# Patient Record
Sex: Male | Born: 1975 | Race: White | Hispanic: No | Marital: Single | Smoking: Current every day smoker
Health system: Southern US, Community
[De-identification: ages and names within clinical notes are randomized; demographics above are authoritative.]

## PROBLEM LIST (undated history)

## (undated) DIAGNOSIS — G510 Bell's palsy: Secondary | ICD-10-CM

## (undated) DIAGNOSIS — E079 Disorder of thyroid, unspecified: Secondary | ICD-10-CM

## (undated) HISTORY — PX: KNEE SURGERY: SHX244

---

## 2006-05-26 ENCOUNTER — Emergency Department (HOSPITAL_COMMUNITY): Admission: EM | Admit: 2006-05-26 | Discharge: 2006-05-26 | Payer: Self-pay | Admitting: Emergency Medicine

## 2006-06-04 ENCOUNTER — Emergency Department (HOSPITAL_COMMUNITY): Admission: EM | Admit: 2006-06-04 | Discharge: 2006-06-05 | Payer: Self-pay | Admitting: Emergency Medicine

## 2006-06-28 ENCOUNTER — Emergency Department (HOSPITAL_COMMUNITY): Admission: EM | Admit: 2006-06-28 | Discharge: 2006-06-28 | Payer: Self-pay | Admitting: Emergency Medicine

## 2006-07-02 ENCOUNTER — Emergency Department (HOSPITAL_COMMUNITY): Admission: EM | Admit: 2006-07-02 | Discharge: 2006-07-02 | Payer: Self-pay | Admitting: Emergency Medicine

## 2010-08-21 ENCOUNTER — Emergency Department (HOSPITAL_COMMUNITY): Admission: EM | Admit: 2010-08-21 | Discharge: 2010-02-28 | Payer: Self-pay | Admitting: Emergency Medicine

## 2016-04-27 ENCOUNTER — Encounter (HOSPITAL_COMMUNITY): Payer: Self-pay | Admitting: Emergency Medicine

## 2016-04-27 ENCOUNTER — Emergency Department (HOSPITAL_COMMUNITY): Payer: Self-pay

## 2016-04-27 ENCOUNTER — Emergency Department (HOSPITAL_COMMUNITY)
Admission: EM | Admit: 2016-04-27 | Discharge: 2016-04-28 | Disposition: A | Discharge status: Home / Self Care | Payer: Self-pay | Attending: Emergency Medicine | Admitting: Emergency Medicine

## 2016-04-27 DIAGNOSIS — Y9389 Activity, other specified: Secondary | ICD-10-CM | POA: Insufficient documentation

## 2016-04-27 DIAGNOSIS — S61011A Laceration without foreign body of right thumb without damage to nail, initial encounter: Secondary | ICD-10-CM | POA: Insufficient documentation

## 2016-04-27 DIAGNOSIS — Z23 Encounter for immunization: Secondary | ICD-10-CM | POA: Insufficient documentation

## 2016-04-27 DIAGNOSIS — E039 Hypothyroidism, unspecified: Secondary | ICD-10-CM | POA: Insufficient documentation

## 2016-04-27 DIAGNOSIS — S21211A Laceration without foreign body of right back wall of thorax without penetration into thoracic cavity, initial encounter: Secondary | ICD-10-CM | POA: Insufficient documentation

## 2016-04-27 DIAGNOSIS — Y929 Unspecified place or not applicable: Secondary | ICD-10-CM | POA: Insufficient documentation

## 2016-04-27 DIAGNOSIS — E872 Acidosis, unspecified: Secondary | ICD-10-CM

## 2016-04-27 DIAGNOSIS — S71112A Laceration without foreign body, left thigh, initial encounter: Secondary | ICD-10-CM | POA: Insufficient documentation

## 2016-04-27 DIAGNOSIS — Z79899 Other long term (current) drug therapy: Secondary | ICD-10-CM | POA: Insufficient documentation

## 2016-04-27 DIAGNOSIS — Z5181 Encounter for therapeutic drug level monitoring: Secondary | ICD-10-CM | POA: Insufficient documentation

## 2016-04-27 DIAGNOSIS — Y999 Unspecified external cause status: Secondary | ICD-10-CM | POA: Insufficient documentation

## 2016-04-27 DIAGNOSIS — F17200 Nicotine dependence, unspecified, uncomplicated: Secondary | ICD-10-CM | POA: Insufficient documentation

## 2016-04-27 HISTORY — DX: Disorder of thyroid, unspecified: E07.9

## 2016-04-27 HISTORY — DX: Bell's palsy: G51.0

## 2016-04-27 LAB — COMPREHENSIVE METABOLIC PANEL
ALT: 26 U/L (ref 17–63)
ANION GAP: 9 (ref 5–15)
AST: 37 U/L (ref 15–41)
Albumin: 3.7 g/dL (ref 3.5–5.0)
Alkaline Phosphatase: 73 U/L (ref 38–126)
BUN: 6 mg/dL (ref 6–20)
CALCIUM: 8.5 mg/dL — AB (ref 8.9–10.3)
CHLORIDE: 110 mmol/L (ref 101–111)
CO2: 21 mmol/L — AB (ref 22–32)
Creatinine, Ser: 0.92 mg/dL (ref 0.61–1.24)
GFR calc non Af Amer: >60 mL/min (ref >60)
Glucose, Bld: 95 mg/dL (ref 65–99)
Potassium: 3.4 mmol/L — ABNORMAL LOW (ref 3.5–5.1)
SODIUM: 140 mmol/L (ref 135–145)
Total Bilirubin: 0.2 mg/dL — ABNORMAL LOW (ref 0.3–1.2)
Total Protein: 6 g/dL — ABNORMAL LOW (ref 6.5–8.1)

## 2016-04-27 LAB — PREPARE FRESH FROZEN PLASMA
Unit division: 0
Unit division: 0

## 2016-04-27 LAB — CBC
HCT: 39.4 % (ref 39.0–52.0)
HEMOGLOBIN: 13.5 g/dL (ref 13.0–17.0)
MCH: 28.1 pg (ref 26.0–34.0)
MCHC: 34.3 g/dL (ref 30.0–36.0)
MCV: 82.1 fL (ref 78.0–100.0)
Platelets: 254 10*3/uL (ref 150–400)
RBC: 4.8 MIL/uL (ref 4.22–5.81)
RDW: 13.1 % (ref 11.5–15.5)
WBC: 6.1 10*3/uL (ref 4.0–10.5)

## 2016-04-27 LAB — ABO/RH: ABO/RH(D): O POS

## 2016-04-27 LAB — I-STAT CHEM 8, ED
BUN: 5 mg/dL — ABNORMAL LOW (ref 6–20)
CALCIUM ION: 1.02 mmol/L — AB (ref 1.13–1.30)
Chloride: 106 mmol/L (ref 101–111)
Creatinine, Ser: 1 mg/dL (ref 0.61–1.24)
GLUCOSE: 92 mg/dL (ref 65–99)
HCT: 39 % (ref 39.0–52.0)
HEMOGLOBIN: 13.3 g/dL (ref 13.0–17.0)
Potassium: 3.4 mmol/L — ABNORMAL LOW (ref 3.5–5.1)
Sodium: 143 mmol/L (ref 135–145)
TCO2: 20 mmol/L (ref 0–100)

## 2016-04-27 LAB — PROTIME-INR
INR: 0.98
PROTHROMBIN TIME: 13 s (ref 11.4–15.2)

## 2016-04-27 LAB — I-STAT CG4 LACTIC ACID, ED
Lactic Acid, Venous: 4.1 mmol/L (ref 0.5–1.9)
Lactic Acid, Venous: 1.65 mmol/L (ref 0.5–1.9)

## 2016-04-27 LAB — ETHANOL: ALCOHOL ETHYL (B): 138 mg/dL — AB (ref <5)

## 2016-04-27 MED ORDER — SODIUM CHLORIDE 0.9 % IV BOLUS (SEPSIS)
1000.0000 mL | Freq: Once | INTRAVENOUS | Status: AC
  Administered 2016-04-27: 1000 mL via INTRAVENOUS

## 2016-04-27 MED ORDER — SODIUM CHLORIDE 0.9 % IV SOLN
INTRAVENOUS | Status: AC | PRN
  Administered 2016-04-27: 1000 mL via INTRAVENOUS

## 2016-04-27 MED ORDER — CARBAMAZEPINE ER 200 MG PO TB12
200.0000 mg | ORAL_TABLET | Freq: Once | ORAL | Status: DC
  Filled 2016-04-27: qty 1

## 2016-04-27 MED ORDER — POTASSIUM CHLORIDE CRYS ER 20 MEQ PO TBCR
80.0000 meq | EXTENDED_RELEASE_TABLET | Freq: Once | ORAL | Status: AC
  Administered 2016-04-27: 80 meq via ORAL
  Filled 2016-04-27: qty 4

## 2016-04-27 MED ORDER — AMITRIPTYLINE HCL 25 MG PO TABS
50.0000 mg | ORAL_TABLET | Freq: Every day | ORAL | Status: DC
  Filled 2016-04-27: qty 2

## 2016-04-27 MED ORDER — TETANUS-DIPHTH-ACELL PERTUSSIS 5-2.5-18.5 LF-MCG/0.5 IM SUSP
0.5000 mL | Freq: Once | INTRAMUSCULAR | Status: AC
  Administered 2016-04-27: 0.5 mL via INTRAMUSCULAR

## 2016-04-27 MED ORDER — GABAPENTIN 300 MG PO CAPS
600.0000 mg | ORAL_CAPSULE | Freq: Two times a day (BID) | ORAL | Status: DC
  Filled 2016-04-27: qty 2

## 2016-04-27 MED ORDER — PHENOBARBITAL 60 MG PO TABS
60.0000 mg | ORAL_TABLET | Freq: Once | ORAL | Status: DC
  Filled 2016-04-27: qty 1

## 2016-04-27 MED ORDER — TETANUS-DIPHTH-ACELL PERTUSSIS 5-2.5-18.5 LF-MCG/0.5 IM SUSP
INTRAMUSCULAR | Status: AC
  Administered 2016-04-27: 0.5 mL via INTRAMUSCULAR
  Filled 2016-04-27: qty 0.5

## 2016-04-27 MED ORDER — LIDOCAINE HCL (PF) 1 % IJ SOLN
20.0000 mL | Freq: Once | INTRAMUSCULAR | Status: AC
  Administered 2016-04-28: 20 mL via INTRADERMAL
  Filled 2016-04-27: qty 20
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
## 2016-04-27 NOTE — Progress Notes (Signed)
Orthopedic Tech Progress Note Patient Details:  Sean Strickland 24-Dec-1975 [id number] Level 1 trauma ortho visit. Patient ID: Sean Strickland, male   DOB: 24-Dec-1975, 40 y.o.   MRN: [id number]   Sean Strickland, Sean Strickland 04/27/2016, 8:47 PM
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
## 2016-04-27 NOTE — ED Notes (Signed)
Pt verbally aggressive and threatening to staff and to [place] police at bedside. MD aware and at bedside. No new orders.
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
## 2016-04-27 NOTE — ED Notes (Signed)
Patient refused Urine Analysis.

## 2016-04-28 LAB — TYPE AND SCREEN
ABO/RH(D): O POS
Antibody Screen: NEGATIVE
UNIT DIVISION: 0
UNIT DIVISION: 0

## 2016-04-28 LAB — CDS SEROLOGY

## 2016-04-28 MED ORDER — PHENOBARBITAL 32.4 MG PO TABS
64.8000 mg | ORAL_TABLET | Freq: Once | ORAL | Status: DC
  Filled 2016-04-28: qty 2

## 2016-04-28 MED ORDER — PENICILLIN V POTASSIUM 250 MG PO TABS: 500.0000 mg | ORAL_TABLET | Freq: Once | ORAL | Status: DC

## 2016-04-28 MED ORDER — PENICILLIN V POTASSIUM 500 MG PO TABS: 500.0000 mg | ORAL_TABLET | Freq: Three times a day (TID) | ORAL | 0 refills | Status: AC

## 2016-04-28 NOTE — Discharge Instructions (Signed)
You can take tylenol 650mg  every 6 hours for pain. You can add Ibuprofen 400 mg 3 hours later if you still have pain.   Change your dressings every 12 hours. Apply bacitracin to the wound during dressing changes. Watch out for signs of infection and fevers >100.75F

## 2016-04-28 NOTE — ED Notes (Signed)
Pt arrested by [employer] p/t to discharge and administration of medications, see EMAR.
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
## 2017-07-05 ENCOUNTER — Inpatient Hospital Stay (HOSPITAL_COMMUNITY)
Admission: EM | Admit: 2017-07-05 | Discharge: 2017-07-07 | DRG: 080 | Payer: Self-pay | Attending: Family Medicine | Admitting: Family Medicine

## 2017-07-05 ENCOUNTER — Encounter (HOSPITAL_COMMUNITY): Payer: Self-pay

## 2017-07-05 ENCOUNTER — Emergency Department (HOSPITAL_COMMUNITY): Payer: Self-pay

## 2017-07-05 DIAGNOSIS — G9341 Metabolic encephalopathy: Secondary | ICD-10-CM | POA: Diagnosis present

## 2017-07-05 DIAGNOSIS — E039 Hypothyroidism, unspecified: Secondary | ICD-10-CM | POA: Diagnosis present

## 2017-07-05 DIAGNOSIS — E860 Dehydration: Secondary | ICD-10-CM | POA: Diagnosis present

## 2017-07-05 DIAGNOSIS — E162 Hypoglycemia, unspecified: Secondary | ICD-10-CM

## 2017-07-05 DIAGNOSIS — F319 Bipolar disorder, unspecified: Secondary | ICD-10-CM | POA: Diagnosis present

## 2017-07-05 DIAGNOSIS — R001 Bradycardia, unspecified: Secondary | ICD-10-CM

## 2017-07-05 DIAGNOSIS — F3110 Bipolar disorder, current episode manic without psychotic features, unspecified: Secondary | ICD-10-CM | POA: Diagnosis present

## 2017-07-05 DIAGNOSIS — Z9119 Patient's noncompliance with other medical treatment and regimen: Secondary | ICD-10-CM

## 2017-07-05 DIAGNOSIS — R400 Somnolence: Secondary | ICD-10-CM

## 2017-07-05 DIAGNOSIS — E035 Myxedema coma: Principal | ICD-10-CM

## 2017-07-05 DIAGNOSIS — G9340 Encephalopathy, unspecified: Secondary | ICD-10-CM | POA: Diagnosis present

## 2017-07-05 DIAGNOSIS — F17210 Nicotine dependence, cigarettes, uncomplicated: Secondary | ICD-10-CM | POA: Diagnosis present

## 2017-07-05 DIAGNOSIS — M6282 Rhabdomyolysis: Secondary | ICD-10-CM

## 2017-07-05 LAB — CBC
HCT: 38.9 % — ABNORMAL LOW (ref 39.0–52.0)
Hemoglobin: 13.5 g/dL (ref 13.0–17.0)
MCH: 28.1 pg (ref 26.0–34.0)
MCHC: 34.7 g/dL (ref 30.0–36.0)
MCV: 81 fL (ref 78.0–100.0)
Platelets: 277 10*3/uL (ref 150–400)
RBC: 4.8 MIL/uL (ref 4.22–5.81)
RDW: 13.1 % (ref 11.5–15.5)
WBC: 9.5 10*3/uL (ref 4.0–10.5)

## 2017-07-05 LAB — URINALYSIS, ROUTINE W REFLEX MICROSCOPIC
Glucose, UA: 50 mg/dL — AB
Hgb urine dipstick: NEGATIVE
Ketones, ur: 20 mg/dL — AB
Leukocytes, UA: NEGATIVE
Nitrite: NEGATIVE
Protein, ur: 100 mg/dL — AB
Specific Gravity, Urine: 1.034 — ABNORMAL HIGH (ref 1.005–1.030)
Squamous Epithelial / HPF: NONE SEEN
pH: 5 (ref 5.0–8.0)

## 2017-07-05 LAB — COMPREHENSIVE METABOLIC PANEL WITH GFR
ALT: 59 U/L (ref 17–63)
AST: 134 U/L — ABNORMAL HIGH (ref 15–41)
Albumin: 4.2 g/dL (ref 3.5–5.0)
Alkaline Phosphatase: 80 U/L (ref 38–126)
Anion gap: 9 (ref 5–15)
BUN: 25 mg/dL — ABNORMAL HIGH (ref 6–20)
CO2: 25 mmol/L (ref 22–32)
Calcium: 8.7 mg/dL — ABNORMAL LOW (ref 8.9–10.3)
Chloride: 104 mmol/L (ref 101–111)
Creatinine, Ser: 1.01 mg/dL (ref 0.61–1.24)
GFR calc Af Amer: >60 mL/min
GFR calc non Af Amer: >60 mL/min
Glucose, Bld: 77 mg/dL (ref 65–99)
Potassium: 4 mmol/L (ref 3.5–5.1)
Sodium: 138 mmol/L (ref 135–145)
Total Bilirubin: 1.2 mg/dL (ref 0.3–1.2)
Total Protein: 6.9 g/dL (ref 6.5–8.1)

## 2017-07-05 LAB — RAPID URINE DRUG SCREEN, HOSP PERFORMED
Amphetamines: NOT DETECTED
Barbiturates: NOT DETECTED
Benzodiazepines: NOT DETECTED
Cocaine: NOT DETECTED
Opiates: NOT DETECTED
Tetrahydrocannabinol: POSITIVE — AB

## 2017-07-05 LAB — CBG MONITORING, ED
GLUCOSE-CAPILLARY: 148 mg/dL — AB (ref 65–99)
Glucose-Capillary: 100 mg/dL — ABNORMAL HIGH (ref 65–99)
Glucose-Capillary: 174 mg/dL — ABNORMAL HIGH (ref 65–99)
Glucose-Capillary: 59 mg/dL — ABNORMAL LOW (ref 65–99)
Glucose-Capillary: 74 mg/dL (ref 65–99)
Glucose-Capillary: 99 mg/dL (ref 65–99)

## 2017-07-05 MED ORDER — THIAMINE HCL 100 MG/ML IJ SOLN
100.0000 mg | Freq: Every day | INTRAMUSCULAR | Status: DC
  Administered 2017-07-06: 100 mg via INTRAVENOUS
  Filled 2017-07-05: qty 2

## 2017-07-05 MED ORDER — DEXTROSE 50 % IV SOLN
1.0000 | Freq: Once | INTRAVENOUS | Status: AC
  Administered 2017-07-05: 50 mL via INTRAVENOUS
  Filled 2017-07-05: qty 50

## 2017-07-05 MED ORDER — SODIUM CHLORIDE 0.9 % IV BOLUS (SEPSIS)
500.0000 mL | Freq: Once | INTRAVENOUS | Status: AC
  Administered 2017-07-06: 500 mL via INTRAVENOUS

## 2017-07-05 MED ORDER — DEXTROSE 50 % IV SOLN
INTRAVENOUS | Status: AC
  Filled 2017-07-05: qty 50

## 2017-07-05 MED ORDER — DEXTROSE 50 % IV SOLN
1.0000 | Freq: Once | INTRAVENOUS | Status: AC
  Administered 2017-07-05: 50 mL via INTRAVENOUS
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
## 2017-07-05 NOTE — H&P (Signed)
Sean Strickland WUJ:[id number] DOB: July 27, 1976 DOA: 07/05/2017     PCP: Patient, No Pcp Per   Outpatient Specialists: none Patient coming from:  Homeless in the past From jail  Chief Complaint: confusion  HPI: Sean Strickland is a 41 y.o. male with medical history significant of hypothyroidism and psychiatric disorder    Presented with altered mental status comes from [place] was given 2 mg of Ativan IM prior to arriving to ER. Unable to provide his own history but per jail staff has become more confused over past 1-2 weeks. He has been held in isolation due to aggressive behavior for at least 1 week. Was started on Effexor, phenobarbital, Neurontin, Amitriptalyne, Tegretol,  last week was having hallucinations. Prior to arrival he has been walking but very confused. Unsure if was taking any of the medications. Has hx of Hypothyroidism  Was supposed to be on synthroid but unclear if was taking it.  Initially hypoglycemic Glu 50's still lethargic treated with D50. Temp 97.4, bradycardic into 50's Work up showed evidence of severe Hypothyroidism likely due to noncompliance  IN ER:  Temp (24hrs), Avg:98 F (36.7 C), Min:97.4 F (36.3 C), Max:98.6 F (37 C)      on arrival  ED Triage Vitals  Enc Vitals Group     BP 07/05/17 1825 120/90     Pulse Rate 07/05/17 1825 69     Resp 07/05/17 1825 16     Temp 07/05/17 1825 98.6 F (37 C)     Temp Source 07/05/17 1825 Oral     SpO2 07/05/17 1825 100 %     Weight --      Height --      Head Circumference --      Peak Flow --      Pain Score 07/05/17 1911 0     Pain Loc --      Pain Edu? --      Excl. in GC? --     Latest  RR 11 HR 52 BP 95/83 Na 138 K 4.0 BUN 25 alb 4.2 WBC 9.5 Hg 13.5 plt 277  CK 2800 TSH 76 T4 < 0.25 CXR non acute CT head frontal hematoma of the scalp Following Medications were ordered in ER: Medications  dextrose 50 % solution 50 mL (50 mLs Intravenous Given 07/05/17 1946)  dextrose 50 %  solution 50 mL (50 mLs Intravenous Given 07/05/17 2218)    Hospitalist was called for admission for altered mental status  Review of Systems:    Pertinent positives include: Confusion/hallucinations Otherwise unable to obtain detailed history secondary to altered mental status  Past Medical History: Past Medical History:  Diagnosis Date  . Bell's palsy   . Thyroid disease    hypothyroid   Past Surgical History:  Procedure Laterality Date  . KNEE SURGERY Right      Social History:  Ambulatory   independently     reports that he has been smoking.  He has never used smokeless tobacco. He reports that he uses drugs, including Marijuana. He reports that he does not drink alcohol.  Allergies:  No Known Allergies     Family History: Unable to obtain Medications: Prior to Admission medications   Medication Sig Start Date End Date Taking? Authorizing Provider  amitriptyline (ELAVIL) 50 MG tablet Take 50 mg by mouth at bedtime.    [provider]  carbamazepine (TEGRETOL XR) 200 MG 12 hr tablet Take 200 mg by mouth 2 (two) times daily.  [provider]  gabapentin (NEURONTIN) 300 MG capsule Take 600 mg by mouth 2 (two) times daily.    [provider]  levothyroxine (SYNTHROID, LEVOTHROID) 175 MCG tablet Take 175 mcg by mouth daily before breakfast.    [provider]  naproxen (NAPROSYN) 500 MG tablet Take 500 mg by mouth 2 (two) times daily with a meal.    [provider]  PHENObarbital (LUMINAL) 30 MG tablet Take 60 mg by mouth 2 (two) times daily.    [provider]  venlafaxine XR (EFFEXOR-XR) 75 MG 24 hr capsule Take 225 mg by mouth daily with breakfast.    [provider]    Physical Exam: Patient Vitals for the past 24 hrs:  BP Temp Temp src Pulse Resp SpO2  07/05/17 2315 95/83 - - - 11 -  07/05/17 2300 110/80 - - - 15 -  07/05/17 2245 114/90 - - - 11 -  07/05/17 2230 106/69 - - - 12 -  07/05/17 2215  110/78 - - - 16 -  07/05/17 2200 116/80 - - (!) 45 11 100 %  07/05/17 2145 - - - - 16 -  07/05/17 2130 91/70 - - (!) 47 11 99 %  07/05/17 2115 102/72 - - (!) 54 10 100 %  07/05/17 2113 - (!) 97.4 F (36.3 C) Rectal - - -  07/05/17 1945 - - - (!) 44 11 92 %  07/05/17 1930 95/67 - - - - -  07/05/17 1825 120/90 98.6 F (37 C) Oral 69 16 100 %    1. General:  in No Acute distress   Chronically ill -appearing 2. Psychological: Alert but not Oriented 3. Head/ENT:    Dry Mucous Membranes                          Head Non traumatic, neck supple                          Normal   Dentition 4. SKIN:  decreased Skin turgor,  Skin clean Dry and intact no rash 5. Heart: Regular rate and rhythm no  Murmur, no Rub or gallop 6. Lungs:  no wheezes or crackles   7. Abdomen: Lower abdominal tenderness, distended diminished bowel sounds present 8. Lower extremities: no clubbing, cyanosis, or edema 9. Neurologically Grossly intact, moving all 4 extremities equally spontaneously not cooperative with exam  10. MSK: Normal range of motion   body mass index is unknown because there is no height or weight on file.  Labs on Admission:   Labs on Admission: I have personally reviewed following labs and imaging studies  CBC:  Recent Labs Lab 07/05/17 1927  WBC 9.5  HGB 13.5  HCT 38.9*  MCV 81.0  PLT 277   Basic Metabolic Panel:  Recent Labs Lab 07/05/17 1927  NA 138  K 4.0  CL 104  CO2 25  GLUCOSE 77  BUN 25*  CREATININE 1.01  CALCIUM 8.7*   GFR: CrCl cannot be calculated (Unknown ideal weight.). Liver Function Tests:  Recent Labs Lab 07/05/17 1927  AST 134*  ALT 59  ALKPHOS 80  BILITOT 1.2  PROT 6.9  ALBUMIN 4.2   No results for input(s): LIPASE, AMYLASE in the last 168 hours. No results for input(s): AMMONIA in the last 168 hours. Coagulation Profile: No results for input(s): INR, PROTIME in the last 168 hours. Cardiac Enzymes: No results for  input(s): CKTOTAL, CKMB,  CKMBINDEX, TROPONINI in the last 168 hours. BNP (last 3 results) No results for input(s): PROBNP in the last 8760 hours. HbA1C: No results for input(s): HGBA1C in the last 72 hours. CBG:  Recent Labs Lab 07/05/17 1940 07/05/17 2012 07/05/17 2116 07/05/17 2208 07/05/17 2246  GLUCAP 59* 148* 100* 74 174*   Lipid Profile: No results for input(s): CHOL, HDL, LDLCALC, TRIG, CHOLHDL, LDLDIRECT in the last 72 hours. Thyroid Function Tests: No results for input(s): TSH, T4TOTAL, FREET4, T3FREE, THYROIDAB in the last 72 hours. Anemia Panel: No results for input(s): VITAMINB12, FOLATE, FERRITIN, TIBC, IRON, RETICCTPCT in the last 72 hours. Urine analysis:    Component Value Date/Time   COLORURINE AMBER (A) 07/05/2017 2150   APPEARANCEUR HAZY (A) 07/05/2017 2150   LABSPEC 1.034 (H) 07/05/2017 2150   PHURINE 5.0 07/05/2017 2150   GLUCOSEU 50 (A) 07/05/2017 2150   HGBUR NEGATIVE 07/05/2017 2150   BILIRUBINUR SMALL (A) 07/05/2017 2150   KETONESUR 20 (A) 07/05/2017 2150   PROTEINUR 100 (A) 07/05/2017 2150   NITRITE NEGATIVE 07/05/2017 2150   LEUKOCYTESUR NEGATIVE 07/05/2017 2150   Sepsis Labs: @LABRCNTIP (procalcitonin:4,lacticidven:4) )No results found for this or any previous visit (from the past 240 hour(s)).    UA  no evidence of UTI   No results found for: HGBA1C  CrCl cannot be calculated (Unknown ideal weight.).  BNP (last 3 results) No results for input(s): PROBNP in the last 8760 hours.   ECG REPORT  Independently reviewed Rate: 57  Rhythm: Sinus bradycardia, slightly prolonged PR ST&T Change: No acute ischemic changes   QTC 405  There were no vitals filed for this visit.   Cultures: No results found for: SDES, SPECREQUEST, CULT, REPTSTATUS   Radiological Exams on Admission: Ct Head Wo Contrast  Result Date: 07/05/2017 CLINICAL DATA:  Altered mental status.  Pale, lethargic. EXAM: CT HEAD WITHOUT CONTRAST TECHNIQUE: Contiguous axial images were obtained  from the base of the skull through the vertex without intravenous contrast. COMPARISON:  None. FINDINGS: Brain: Ventricles are normal in size and configuration. All areas of the brain demonstrate normal gray-white matter attenuation. There is no mass, hemorrhage, edema or other evidence of acute parenchymal abnormality. No extra-axial hemorrhage. Vascular: No hyperdense vessel or unexpected calcification. Skull: Normal. Negative for fracture or focal lesion. Sinuses/Orbits: No acute finding. Other: Probable mild soft tissue edema within the superficial soft tissues overlying the midline frontal bones. IMPRESSION: 1. Probable mild soft tissue edema overlying the midline frontal bones. No underlying fracture. 2. No acute intracranial abnormality. No intracranial mass, hemorrhage or edema. Electronically Signed   By: Bary Richard M.D.   On: 07/05/2017 21:06   Dg Chest Portable 1 View  Result Date: 07/05/2017 CLINICAL DATA:  Acute onset of altered mental status. Initial encounter. EXAM: PORTABLE CHEST 1 VIEW COMPARISON:  None. FINDINGS: The lungs are well-aerated and clear. There is no evidence of focal opacification, pleural effusion or pneumothorax. The cardiomediastinal silhouette is within normal limits. No acute osseous abnormalities are seen. IMPRESSION: No acute cardiopulmonary process seen. Electronically Signed   By: Roanna Raider M.D.   On: 07/05/2017 22:46    Chart has been reviewed    Assessment/Plan   41 y.o. male with medical history significant of hypothyroidism and psychiatric disorder  Admitted for AMS  Present on Admission: . Myxedema coma (HCC)/ myxedema madness; mid agitated delirium in the setting of severe hypothyroidism possibly secondary to noncompliance. Admit to step down initiate stress dose steroids, IV Synthroid 200  mcg now and then 100 daily as well as T3 5 milligrams now and 2.5 every 8 until stable. Monitor for arrhythmias . Bradycardia in the setting of hypothyroidism  monitor and step down expect improvement . Acute metabolic encephalopathy secondary to severe hypothyroidism and polypharmacy U tox negative for phenobarbital unsure if patient actually was taking any of this medications will need to clarify in a.m. monitor for any signs of withdrawal from benzodiazepines or phenobarbital unsure if patient has been on prolonged course of either one. Given altered mental status will check EEG and monitor for seizure activity.    . Rhabdomyolysis setting of severe hypothyroidism will rehydrate and check CK in a.m.   Other plan as per orders.  DVT prophylaxis:  SCD    Code Status:  FULL CODE   Family Communication:   Family not  at  Bedside    Disposition Plan:     Back to current facility when stable                                                                           Consults called: Behavioaral health   Admission status: obs   Level of care tele     I have spent a total of 76 min on this admission    Vauda Salvucci 07/06/2017, 2:53 AM     [facility]  Pager [phone]   after 2 AM please page floor coverage PA If 7AM-7PM, please contact the day team taking care of the patient  [email or web address]  Password [email or web address]

## 2017-07-05 NOTE — ED Triage Notes (Signed)
[facility]. Pt is AOX4 at baseline. Pt is very pale in color, lethargic. Pt was given 2mg  of ativan IM prior to leaving the jail.

## 2017-07-06 ENCOUNTER — Observation Stay (HOSPITAL_COMMUNITY): Payer: Self-pay

## 2017-07-06 ENCOUNTER — Encounter (HOSPITAL_COMMUNITY): Payer: Self-pay | Admitting: Internal Medicine

## 2017-07-06 DIAGNOSIS — M6282 Rhabdomyolysis: Secondary | ICD-10-CM | POA: Diagnosis present

## 2017-07-06 DIAGNOSIS — F1210 Cannabis abuse, uncomplicated: Secondary | ICD-10-CM

## 2017-07-06 DIAGNOSIS — F17210 Nicotine dependence, cigarettes, uncomplicated: Secondary | ICD-10-CM

## 2017-07-06 DIAGNOSIS — F3110 Bipolar disorder, current episode manic without psychotic features, unspecified: Secondary | ICD-10-CM

## 2017-07-06 DIAGNOSIS — E035 Myxedema coma: Secondary | ICD-10-CM | POA: Diagnosis present

## 2017-07-06 DIAGNOSIS — G9340 Encephalopathy, unspecified: Secondary | ICD-10-CM

## 2017-07-06 LAB — CBG MONITORING, ED
GLUCOSE-CAPILLARY: 102 mg/dL — AB (ref 65–99)
GLUCOSE-CAPILLARY: 131 mg/dL — AB (ref 65–99)
GLUCOSE-CAPILLARY: 147 mg/dL — AB (ref 65–99)
GLUCOSE-CAPILLARY: 61 mg/dL — AB (ref 65–99)
GLUCOSE-CAPILLARY: 65 mg/dL (ref 65–99)
GLUCOSE-CAPILLARY: 82 mg/dL (ref 65–99)
GLUCOSE-CAPILLARY: 93 mg/dL (ref 65–99)
Glucose-Capillary: 67 mg/dL (ref 65–99)
Glucose-Capillary: 89 mg/dL (ref 65–99)

## 2017-07-06 LAB — CK
Total CK: 2781 U/L — ABNORMAL HIGH (ref 49–397)
Total CK: 2313 U/L — ABNORMAL HIGH (ref 49–397)

## 2017-07-06 LAB — COMPREHENSIVE METABOLIC PANEL
ALBUMIN: 3.8 g/dL (ref 3.5–5.0)
ALT: 48 U/L (ref 17–63)
ANION GAP: 10 (ref 5–15)
AST: 101 U/L — ABNORMAL HIGH (ref 15–41)
Alkaline Phosphatase: 75 U/L (ref 38–126)
BILIRUBIN TOTAL: 0.8 mg/dL (ref 0.3–1.2)
BUN: 22 mg/dL — ABNORMAL HIGH (ref 6–20)
CO2: 21 mmol/L — ABNORMAL LOW (ref 22–32)
Calcium: 8.2 mg/dL — ABNORMAL LOW (ref 8.9–10.3)
Chloride: 105 mmol/L (ref 101–111)
Creatinine, Ser: 0.8 mg/dL (ref 0.61–1.24)
GFR calc Af Amer: >60 mL/min (ref >60)
GFR calc non Af Amer: >60 mL/min (ref >60)
GLUCOSE: 71 mg/dL (ref 65–99)
POTASSIUM: 3.3 mmol/L — AB (ref 3.5–5.1)
Sodium: 136 mmol/L (ref 135–145)
TOTAL PROTEIN: 5.9 g/dL — AB (ref 6.5–8.1)

## 2017-07-06 LAB — I-STAT VENOUS BLOOD GAS, ED
Acid-Base Excess: 3 mmol/L — ABNORMAL HIGH (ref 0.0–2.0)
Bicarbonate: 28.2 mmol/L — ABNORMAL HIGH (ref 20.0–28.0)
O2 SAT: 56 %
PH VEN: 7.401 (ref 7.250–7.430)
TCO2: 30 mmol/L (ref 22–32)
pCO2, Ven: 45.4 mmHg (ref 44.0–60.0)
pO2, Ven: 30 mmHg — CL (ref 32.0–45.0)

## 2017-07-06 LAB — GLUCOSE, CAPILLARY
Glucose-Capillary: 104 mg/dL — ABNORMAL HIGH (ref 65–99)
Glucose-Capillary: 91 mg/dL (ref 65–99)

## 2017-07-06 LAB — CBC
HEMATOCRIT: 39.4 % (ref 39.0–52.0)
HEMOGLOBIN: 13.6 g/dL (ref 13.0–17.0)
MCH: 28 pg (ref 26.0–34.0)
MCHC: 34.5 g/dL (ref 30.0–36.0)
MCV: 81.2 fL (ref 78.0–100.0)
Platelets: 264 10*3/uL (ref 150–400)
RBC: 4.85 MIL/uL (ref 4.22–5.81)
RDW: 12.9 % (ref 11.5–15.5)
WBC: 8.5 10*3/uL (ref 4.0–10.5)

## 2017-07-06 LAB — TSH: TSH: 75.995 u[IU]/mL — AB (ref 0.350–4.500)

## 2017-07-06 LAB — AMMONIA: AMMONIA: 39 umol/L — AB (ref 9–35)

## 2017-07-06 LAB — LACTIC ACID, PLASMA: Lactic Acid, Venous: 1.3 mmol/L (ref 0.5–1.9)

## 2017-07-06 LAB — PHENOBARBITAL LEVEL

## 2017-07-06 LAB — MAGNESIUM
MAGNESIUM: 2.4 mg/dL (ref 1.7–2.4)
Magnesium: 2.4 mg/dL (ref 1.7–2.4)

## 2017-07-06 LAB — PHOSPHORUS
PHOSPHORUS: 3 mg/dL (ref 2.5–4.6)
Phosphorus: 3.4 mg/dL (ref 2.5–4.6)

## 2017-07-06 LAB — HIV ANTIBODY (ROUTINE TESTING W REFLEX): HIV SCREEN 4TH GENERATION: NONREACTIVE

## 2017-07-06 LAB — CORTISOL: CORTISOL PLASMA: 18.1 ug/dL

## 2017-07-06 LAB — T4, FREE

## 2017-07-06 MED ORDER — LEVOTHYROXINE SODIUM 100 MCG IV SOLR
200.0000 ug | INTRAVENOUS | Status: AC
  Administered 2017-07-06: 200 ug via INTRAVENOUS
  Filled 2017-07-06: qty 10

## 2017-07-06 MED ORDER — LEVOTHYROXINE SODIUM 175 MCG PO TABS: 175.0000 ug | ORAL_TABLET | Freq: Every day | ORAL | Status: DC

## 2017-07-06 MED ORDER — DEXTROSE-NACL 5-0.45 % IV SOLN
INTRAVENOUS | Status: DC
  Administered 2017-07-06: 1000 mL via INTRAVENOUS
  Administered 2017-07-06 – 2017-07-07 (×2): via INTRAVENOUS

## 2017-07-06 MED ORDER — LIOTHYRONINE SODIUM 5 MCG PO TABS
2.5000 ug | ORAL_TABLET | Freq: Three times a day (TID) | ORAL | Status: DC
  Administered 2017-07-06 – 2017-07-07 (×4): 2.5 ug via ORAL
  Filled 2017-07-06 (×4): qty 1

## 2017-07-06 MED ORDER — GABAPENTIN 300 MG PO CAPS
600.0000 mg | ORAL_CAPSULE | Freq: Two times a day (BID) | ORAL | Status: DC
  Administered 2017-07-06 – 2017-07-07 (×2): 600 mg via ORAL
  Filled 2017-07-06 (×2): qty 2

## 2017-07-06 MED ORDER — LEVOTHYROXINE SODIUM 100 MCG IV SOLR
100.0000 ug | Freq: Every day | INTRAVENOUS | Status: DC
  Administered 2017-07-07: 100 ug via INTRAVENOUS
  Filled 2017-07-06: qty 5

## 2017-07-06 MED ORDER — ONDANSETRON HCL 4 MG PO TABS: 4.0000 mg | ORAL_TABLET | Freq: Four times a day (QID) | ORAL | Status: DC | PRN

## 2017-07-06 MED ORDER — POLYETHYLENE GLYCOL 3350 17 G PO PACK: 17.0000 g | PACK | Freq: Every day | ORAL | Status: DC | PRN

## 2017-07-06 MED ORDER — ACETAMINOPHEN 325 MG PO TABS: 650.0000 mg | ORAL_TABLET | Freq: Four times a day (QID) | ORAL | Status: DC | PRN

## 2017-07-06 MED ORDER — DEXTROSE 50 % IV SOLN
INTRAVENOUS | Status: AC
  Administered 2017-07-06: 25 mL
  Filled 2017-07-06: qty 50

## 2017-07-06 MED ORDER — ONDANSETRON HCL 4 MG/2ML IJ SOLN: 4.0000 mg | Freq: Four times a day (QID) | INTRAMUSCULAR | Status: DC | PRN

## 2017-07-06 MED ORDER — ACETAMINOPHEN 650 MG RE SUPP: 650.0000 mg | Freq: Four times a day (QID) | RECTAL | Status: DC | PRN

## 2017-07-06 MED ORDER — SODIUM CHLORIDE 0.9 % IV SOLN
INTRAVENOUS | Status: DC
  Administered 2017-07-06: 1000 mL via INTRAVENOUS
  Administered 2017-07-06: 14:00:00 via INTRAVENOUS

## 2017-07-06 MED ORDER — LIOTHYRONINE SODIUM 5 MCG PO TABS
5.0000 ug | ORAL_TABLET | ORAL | Status: AC
  Administered 2017-07-06: 5 ug via ORAL
  Filled 2017-07-06: qty 1

## 2017-07-06 MED ORDER — DEXTROSE 50 % IV SOLN
25.0000 mL | Freq: Once | INTRAVENOUS | Status: AC
  Administered 2017-07-06: 25 mL via INTRAVENOUS
  Filled 2017-07-06: qty 50

## 2017-07-06 MED ORDER — HYDROCORTISONE NA SUCCINATE PF 100 MG IJ SOLR
100.0000 mg | Freq: Three times a day (TID) | INTRAMUSCULAR | Status: DC
  Administered 2017-07-06 – 2017-07-07 (×5): 100 mg via INTRAVENOUS
  Filled 2017-07-06 (×5): qty 2

## 2017-07-06 MED ORDER — VITAMIN B-1 100 MG PO TABS
100.0000 mg | ORAL_TABLET | Freq: Every day | ORAL | Status: DC
  Administered 2017-07-06 – 2017-07-07 (×2): 100 mg via ORAL
  Filled 2017-07-06 (×2): qty 1

## 2017-07-06 NOTE — ED Notes (Signed)
Attempted to help patient urinate, states he is unable at this time.

## 2017-07-06 NOTE — ED Notes (Signed)
102 BS

## 2017-07-06 NOTE — Progress Notes (Signed)
EEG complete - results pending 

## 2017-07-06 NOTE — ED Notes (Signed)
Pt is now awake and A&O x 4; Pt asking for family and to use phone; pt is in police custody and denied use of phone or contact with family at this time; Pt is calm and corporative with staff; Pt states he has not been taken  Some of his home medications as prescribed and he has not been receiving them while in jail. 2 Deputies remain at beside and pt remain in bilateral hand and feet cuffs; No injuries noted to hands or feet from cuffs;

## 2017-07-06 NOTE — ED Notes (Signed)
CBG 93 

## 2017-07-06 NOTE — ED Notes (Addendum)
Pt given 8oz of orange juice 

## 2017-07-06 NOTE — Consult Note (Signed)
[facility] Consult   Reason for Consult:  Psych medication management and AMS Referring Physician:  Dr. Wendee Beavers Patient Identification: Sean Strickland MRN:  [id number] Principal Diagnosis: Bipolar I disorder, most recent episode (or current) manic ([place]) Diagnosis:   Patient Active Problem List   Diagnosis Date Noted  . Acute encephalopathy [G93.40] 07/06/2017  . 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 Rhabdomyolysis [M62.82] 07/06/2017  . Bradycardia [R00.1] 07/05/2017  . Acute metabolic encephalopathy [W38.88] 07/05/2017    Total Time spent with patient: 1 hour  Subjective:   Sean Strickland is a 41 y.o. male patient admitted with confusion after receiving Ativan in the prison system.  HPI:  Sean Strickland is a 41 y.o. male, seen, chart reviewed for this face-to-face psychiatric consultation and evaluation of recent confusion. Reportedly patient has been suffering with the bipolar disorder, anger outbursts and then received 2 mg of Ativan intramuscular injection which leads to altered mental status and required evaluation in emergency department. Patient was also placed in medical floor for observation. During my evaluation patient is awake, alert, oriented time place person and situation. Patient stated that he has been receiving amitriptyline which he is allergic to and then angry about staff continue to give the same medication. Patient stated that he preferred to get Valium Xanax incident of amitriptyline. 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 Patient has been on Tegretol, phenobarbital, Neurontin and Effexor the patient also received some atropine as needed. Patient also suffering with hypothyroidism and takes Synthroid. Patient denies current symptoms of depression, psychosis, suicidal/homicidal ideation, intention or plans. Patient has capacity to make his own medical decisions and has  improved from his altered mental status since came to the hospital.   Past Psychiatric History: bipolar disorder receiving medication management from prison psychiatrist.  Risk to Self: Is patient at risk for suicide?: No Risk to Others:   Prior Inpatient Therapy:   Prior Outpatient Therapy:    Past Medical History:  Past Medical History:  Diagnosis Date  . Bell's palsy   . Thyroid disease    hypothyroid    Past Surgical History:  Procedure Laterality Date  . KNEE SURGERY Right    Family History: History reviewed. No pertinent family history. Family Psychiatric  History: Unknown Social History:  History  Alcohol Use No     History  Drug Use  . Types: Marijuana    Social History   Social History  . Marital status: Single    Spouse name: N/A  . Number of children: N/A  . Years of education: N/A   Social History Main Topics  . Smoking status: Current Every Day Smoker  . Smokeless tobacco: Never Used  . Alcohol use No  . Drug use: Yes    Types: Marijuana  . Sexual activity: Not Asked   Other Topics Concern  . None   Social History Narrative  . None   Additional Social History:    Allergies:  No Known Allergies  Labs:  Results for orders placed or performed during the hospital encounter of 07/05/17 (from the past 48 hour(s))  Comprehensive metabolic panel     Status: Abnormal   Collection Time: 07/05/17  7:27 PM  Result Value Ref Range   Sodium 138 135 - 145 mmol/L   Potassium 4.0 3.5 - 5.1 mmol/L   Chloride 104 101 - 111 mmol/L   CO2 25 22 - 32 mmol/L   Glucose, Bld 77  65 - 99 mg/dL   BUN 25 (H) 6 - 20 mg/dL   Creatinine, Ser 1.01 0.61 - 1.24 mg/dL   Calcium 8.7 (L) 8.9 - 10.3 mg/dL   Total Protein 6.9 6.5 - 8.1 g/dL   Albumin 4.2 3.5 - 5.0 g/dL   AST 134 (H) 15 - 41 U/L   ALT 59 17 - 63 U/L   Alkaline Phosphatase 80 38 - 126 U/L   Total Bilirubin 1.2 0.3 - 1.2 mg/dL   GFR calc non Af Amer >60 >60 mL/min   GFR calc Af Amer >60 >60 mL/min     Comment: (NOTE) The eGFR has been calculated using the CKD EPI equation. This calculation has not been validated in all clinical situations. eGFR's persistently <60 mL/min signify possible Chronic Kidney Disease.    Anion gap 9 5 - 15  CBC     Status: Abnormal   Collection Time: 07/05/17  7:27 PM  Result Value Ref Range   WBC 9.5 4.0 - 10.5 K/uL   RBC 4.80 4.22 - 5.81 MIL/uL   Hemoglobin 13.5 13.0 - 17.0 g/dL   HCT 38.9 (L) 39.0 - 52.0 %   MCV 81.0 78.0 - 100.0 fL   MCH 28.1 26.0 - 34.0 pg   MCHC 34.7 30.0 - 36.0 g/dL   RDW 13.1 11.5 - 15.5 %   Platelets 277 150 - 400 K/uL  CBG monitoring, ED     Status: Abnormal   Collection Time: 07/05/17  7:40 PM  Result Value Ref Range   Glucose-Capillary 59 (L) 65 - 99 mg/dL  CBG monitoring, ED     Status: Abnormal   Collection Time: 07/05/17  8:12 PM  Result Value Ref Range   Glucose-Capillary 148 (H) 65 - 99 mg/dL  POC CBG, ED     Status: Abnormal   Collection Time: 07/05/17  9:16 PM  Result Value Ref Range   Glucose-Capillary 100 (H) 65 - 99 mg/dL  Urinalysis, Routine w reflex microscopic     Status: Abnormal   Collection Time: 07/05/17  9:50 PM  Result Value Ref Range   Color, Urine AMBER (A) YELLOW    Comment: BIOCHEMICALS MAY BE AFFECTED BY COLOR   APPearance HAZY (A) CLEAR   Specific Gravity, Urine 1.034 (H) 1.005 - 1.030   pH 5.0 5.0 - 8.0   Glucose, UA 50 (A) NEGATIVE mg/dL   Hgb urine dipstick NEGATIVE NEGATIVE   Bilirubin Urine SMALL (A) NEGATIVE   Ketones, ur 20 (A) NEGATIVE mg/dL   Protein, ur 100 (A) NEGATIVE mg/dL   Nitrite NEGATIVE NEGATIVE   Leukocytes, UA NEGATIVE NEGATIVE   RBC / HPF 0-5 0 - 5 RBC/hpf   WBC, UA 0-5 0 - 5 WBC/hpf   Bacteria, UA RARE (A) NONE SEEN   Squamous Epithelial / LPF NONE SEEN NONE SEEN   Mucus PRESENT   Rapid urine drug screen (hospital performed)     Status: Abnormal   Collection Time: 07/05/17  9:50 PM  Result Value Ref Range   Opiates NONE DETECTED NONE DETECTED   Cocaine NONE  DETECTED NONE DETECTED   Benzodiazepines NONE DETECTED NONE DETECTED   Amphetamines NONE DETECTED NONE DETECTED   Tetrahydrocannabinol POSITIVE (A) NONE DETECTED   Barbiturates NONE DETECTED NONE DETECTED    Comment:        DRUG SCREEN FOR MEDICAL PURPOSES ONLY.  IF CONFIRMATION IS NEEDED FOR ANY PURPOSE, NOTIFY LAB WITHIN 5 DAYS.        LOWEST DETECTABLE  LIMITS FOR URINE DRUG SCREEN Drug Class       Cutoff (ng/mL) Amphetamine      1000 Barbiturate      200 Benzodiazepine   720 Tricyclics       947 Opiates          300 Cocaine          300 THC              50   POC CBG, ED     Status: None   Collection Time: 07/05/17 10:08 PM  Result Value Ref Range   Glucose-Capillary 74 65 - 99 mg/dL  POC CBG, ED     Status: Abnormal   Collection Time: 07/05/17 10:46 PM  Result Value Ref Range   Glucose-Capillary 174 (H) 65 - 99 mg/dL  POC CBG, ED     Status: None   Collection Time: 07/05/17 11:50 PM  Result Value Ref Range   Glucose-Capillary 99 65 - 99 mg/dL   Comment 1 Notify RN    Comment 2 Document in Chart   TSH     Status: Abnormal   Collection Time: 07/06/17 12:13 AM  Result Value Ref Range   TSH 75.995 (H) 0.350 - 4.500 uIU/mL    Comment: Performed by a 3rd Generation assay with a functional sensitivity of <=0.01 uIU/mL.  T4, free     Status: Abnormal   Collection Time: 07/06/17 12:13 AM  Result Value Ref Range   Free T4 <0.25 (L) 0.61 - 1.12 ng/dL    Comment: (NOTE) Biotin ingestion may interfere with free T4 tests. If the results are inconsistent with the TSH level, previous test results, or the clinical presentation, then consider biotin interference. If needed, order repeat testing after stopping biotin.   CK     Status: Abnormal   Collection Time: 07/06/17 12:13 AM  Result Value Ref Range   Total CK 2,781 (H) 49 - 397 U/L  Magnesium     Status: None   Collection Time: 07/06/17 12:13 AM  Result Value Ref Range   Magnesium 2.4 1.7 - 2.4 mg/dL  Phosphorus      Status: None   Collection Time: 07/06/17 12:13 AM  Result Value Ref Range   Phosphorus 3.4 2.5 - 4.6 mg/dL  Phenobarbital level     Status: Abnormal   Collection Time: 07/06/17 12:13 AM  Result Value Ref Range   Phenobarbital <5.0 (L) 15.0 - 30.0 ug/mL  Cortisol     Status: None   Collection Time: 07/06/17 12:13 AM  Result Value Ref Range   Cortisol, Plasma 18.1 ug/dL    Comment: (NOTE) AM    6.7 - 22.6 ug/dL PM   <10.0       ug/dL   Lactic acid, plasma     Status: None   Collection Time: 07/06/17 12:17 AM  Result Value Ref Range   Lactic Acid, Venous 1.3 0.5 - 1.9 mmol/L  Ammonia     Status: Abnormal   Collection Time: 07/06/17 12:17 AM  Result Value Ref Range   Ammonia 39 (H) 9 - 35 umol/L  I-Stat venous blood gas, ED     Status: Abnormal   Collection Time: 07/06/17 12:37 AM  Result Value Ref Range   pH, Ven 7.401 7.250 - 7.430   pCO2, Ven 45.4 44.0 - 60.0 mmHg   pO2, Ven 30.0 (LL) 32.0 - 45.0 mmHg   Bicarbonate 28.2 (H) 20.0 - 28.0 mmol/L   TCO2 30 22 - 32  mmol/L   O2 Saturation 56.0 %   Acid-Base Excess 3.0 (H) 0.0 - 2.0 mmol/L   Patient temperature HIDE    Sample type VENOUS    Comment NOTIFIED PHYSICIAN   CBG monitoring, ED     Status: Abnormal   Collection Time: 07/06/17 12:59 AM  Result Value Ref Range   Glucose-Capillary 61 (L) 65 - 99 mg/dL   Comment 1 Notify RN    Comment 2 Document in Chart   CBG monitoring, ED     Status: None   Collection Time: 07/06/17  2:02 AM  Result Value Ref Range   Glucose-Capillary 82 65 - 99 mg/dL   Comment 1 Notify RN    Comment 2 Document in Chart   CBG monitoring, ED     Status: None   Collection Time: 07/06/17  4:12 AM  Result Value Ref Range   Glucose-Capillary 65 65 - 99 mg/dL   Comment 1 Notify RN    Comment 2 Document in Chart   CK     Status: Abnormal   Collection Time: 07/06/17  4:25 AM  Result Value Ref Range   Total CK 2,313 (H) 49 - 397 U/L  Magnesium     Status: None   Collection Time: 07/06/17  4:25 AM   Result Value Ref Range   Magnesium 2.4 1.7 - 2.4 mg/dL  Phosphorus     Status: None   Collection Time: 07/06/17  4:25 AM  Result Value Ref Range   Phosphorus 3.0 2.5 - 4.6 mg/dL  Comprehensive metabolic panel     Status: Abnormal   Collection Time: 07/06/17  4:25 AM  Result Value Ref Range   Sodium 136 135 - 145 mmol/L   Potassium 3.3 (L) 3.5 - 5.1 mmol/L    Comment: DELTA CHECK NOTED   Chloride 105 101 - 111 mmol/L   CO2 21 (L) 22 - 32 mmol/L   Glucose, Bld 71 65 - 99 mg/dL   BUN 22 (H) 6 - 20 mg/dL   Creatinine, Ser 0.80 0.61 - 1.24 mg/dL   Calcium 8.2 (L) 8.9 - 10.3 mg/dL   Total Protein 5.9 (L) 6.5 - 8.1 g/dL   Albumin 3.8 3.5 - 5.0 g/dL   AST 101 (H) 15 - 41 U/L   ALT 48 17 - 63 U/L   Alkaline Phosphatase 75 38 - 126 U/L   Total Bilirubin 0.8 0.3 - 1.2 mg/dL   GFR calc non Af Amer >60 >60 mL/min   GFR calc Af Amer >60 >60 mL/min    Comment: (NOTE) The eGFR has been calculated using the CKD EPI equation. This calculation has not been validated in all clinical situations. eGFR's persistently <60 mL/min signify possible Chronic Kidney Disease.    Anion gap 10 5 - 15  CBC     Status: None   Collection Time: 07/06/17  4:25 AM  Result Value Ref Range   WBC 8.5 4.0 - 10.5 K/uL   RBC 4.85 4.22 - 5.81 MIL/uL   Hemoglobin 13.6 13.0 - 17.0 g/dL   HCT 39.4 39.0 - 52.0 %   MCV 81.2 78.0 - 100.0 fL   MCH 28.0 26.0 - 34.0 pg   MCHC 34.5 30.0 - 36.0 g/dL   RDW 12.9 11.5 - 15.5 %   Platelets 264 150 - 400 K/uL  CBG monitoring, ED     Status: None   Collection Time: 07/06/17  6:21 AM  Result Value Ref Range   Glucose-Capillary 67 65 -  99 mg/dL   Comment 1 Notify RN    Comment 2 Document in Chart   CBG monitoring, ED     Status: Abnormal   Collection Time: 07/06/17  6:46 AM  Result Value Ref Range   Glucose-Capillary 147 (H) 65 - 99 mg/dL   Comment 1 Notify RN    Comment 2 Document in Chart   CBG monitoring, ED     Status: Abnormal   Collection Time: 07/06/17  8:14 AM   Result Value Ref Range   Glucose-Capillary 131 (H) 65 - 99 mg/dL  CBG monitoring, ED     Status: None   Collection Time: 07/06/17 10:16 AM  Result Value Ref Range   Glucose-Capillary 93 65 - 99 mg/dL    Current Facility-Administered Medications  Medication Dose Route Frequency Provider Last Rate Last Dose  . 0.9 %  sodium chloride infusion   Intravenous Continuous Doutova, Anastassia, MD 100 mL/hr at 07/06/17 0304 1,000 mL at 07/06/17 0304  . acetaminophen (TYLENOL) tablet 650 mg  650 mg Oral Q6H PRN Toy Baker, MD       Or  . acetaminophen (TYLENOL) suppository 650 mg  650 mg Rectal Q6H PRN Doutova, Anastassia, MD      . dextrose 5 %-0.45 % sodium chloride infusion   Intravenous Continuous Doutova, Anastassia, MD 100 mL/hr at 07/06/17 0139 1,000 mL at 07/06/17 0139  . hydrocortisone sodium succinate (SOLU-CORTEF) 100 MG injection 100 mg  100 mg Intravenous Q8H Doutova, Anastassia, MD   100 mg at 07/06/17 0413  . [START ON 07/07/2017] levothyroxine (SYNTHROID, LEVOTHROID) injection 100 mcg  100 mcg Intravenous Daily Doutova, Anastassia, MD      . liothyronine (CYTOMEL) tablet 2.5 mcg  2.5 mcg Oral Q8H Doutova, Anastassia, MD      . ondansetron (ZOFRAN) tablet 4 mg  4 mg Oral Q6H PRN Doutova, Anastassia, MD       Or  . ondansetron (ZOFRAN) injection 4 mg  4 mg Intravenous Q6H PRN Doutova, Anastassia, MD      . polyethylene glycol (MIRALAX / GLYCOLAX) packet 17 g  17 g Oral Daily PRN Doutova, Anastassia, MD      . thiamine (B-1) injection 100 mg  100 mg Intravenous Daily Doutova, Anastassia, MD   100 mg at 07/06/17 0029  . thiamine (VITAMIN B-1) tablet 100 mg  100 mg Oral Daily Doutova, Anastassia, MD   100 mg at 07/06/17 1041   Current Outpatient Prescriptions  Medication Sig Dispense Refill  . busPIRone (BUSPAR) 30 MG tablet Take 30 mg by mouth 2 (two) times daily.    Marland Kitchen gabapentin (NEURONTIN) 300 MG capsule Take 600 mg by mouth 2 (two) times daily.    Marland Kitchen lamoTRIgine (LAMICTAL)  200 MG tablet Take 400 mg by mouth every evening.    . mirtazapine (REMERON) 7.5 MG tablet Take 7.5 mg by mouth at bedtime.    Marland Kitchen PHENobarbital (LUMINAL) 32.4 MG tablet Take 64.8 mg by mouth 2 (two) times daily.    . carbamazepine (TEGRETOL XR) 200 MG 12 hr tablet Take 200 mg by mouth 2 (two) times daily.    Marland Kitchen venlafaxine XR (EFFEXOR-XR) 75 MG 24 hr capsule Take 225 mg by mouth daily with breakfast.      Musculoskeletal: Strength & Muscle Tone: within normal limits Gait & Station: unable to stand Patient leans: N/A  Psychiatric Specialty Exam: Physical Exam as per history and physical  ROS patient endorses diagnosis of bipolar disorder, anger outbursts and physical altercations and verbal  arguments in prison. Patient denies suicidal/homicidal ideation and has no evidence of psychosis. Patient showed superficial wounds on his ankles and wrist which seems to be related to fighting with the handcuffs. Patient stated they are because of poisoned with amitriptyline. No Fever-chills, No Headache, No changes with Vision or hearing, reports vertigo No problems swallowing food or Liquids, No Chest pain, Cough or Shortness of Breath, No Abdominal pain, No Nausea or Vommitting, Bowel movements are regular, No Blood in stool or Urine, No dysuria, No new skin rashes or bruises, No new joints pains-aches,  No new weakness, tingling, numbness in any extremity, No recent weight gain or loss, No polyuria, polydypsia or polyphagia,   A full 10 point Review of Systems was done, except as stated above, all other Review of Systems were negative.  Blood pressure 119/78, pulse (!) 55, temperature (!) 97.4 F (36.3 C), temperature source Rectal, resp. rate 16, SpO2 100 %.There is no height or weight on file to calculate BMI.  General Appearance: Guarded  Eye Contact:  Good  Speech:  Pressured  Volume:  Increased  Mood:  Anxious and Irritable  Affect:  Inappropriate and Labile  Thought Process:   Coherent and Goal Directed  Orientation:  Full (Time, Place, and Person)  Thought Content:  Paranoid Ideation and Rumination  Suicidal Thoughts:  No  Homicidal Thoughts:  No  Memory:  Immediate;   Good Recent;   Fair Remote;   Fair  Judgement:  Impaired  Insight:  Fair  Psychomotor Activity:  Increased  Concentration:  Concentration: Fair and Attention Span: Fair  Recall:  Good  Fund of Knowledge:  Good  Language:  Good  Akathisia:  Negative  Handed:  Right  AIMS (if indicated):     Assets:  Communication Skills Desire for Improvement Leisure Time Resilience Transportation  ADL's:  Intact  Cognition:  WNL  Sleep:        Treatment Plan Summary: 41 years old male, brought into the hospital by prison security for altered mental status followed by injecting 2 mg of Ativan and reportedly dehydrated secondary to isolated for 1 week. Patient has paranoid thoughts about amitriptyline being poisoned to his body. Patient endorses anger outbursts but denied suicidal/homicidal ideation, intention or plans.  Bipolar disorder most recent episode is mania  Recommendation: Patient is psychiatrically cleared for discharge and back to the prison system Patient will be followed with the in-house psychiatrist for medication management May reinitiate his home medication including Tegretol-XR 200 mg twice daily, Neurontin 600 mg twice daily, Effexor XR 75 mg 3 tablets daily and BuSpar 30 mg 2 times daily - reportedly helpful and no adverse effects Recommended possible alternate with amitriptyline probably Thorazine or Geodon for aggressive behavior Repeat EKG which indicated QT/QTc 429/450 ms, hich seems to be within normal limits Appreciate psychiatric consultation and we sign off as of today Please contact [phone] or [phone] if needs further assistance   Disposition: Patient does not meet criteria for psychiatric inpatient admission. Supportive therapy provided about ongoing  stressors.  Ambrose Finland, MD 07/06/2017 11:49 AM

## 2017-07-06 NOTE — ED Notes (Signed)
EEG at bedside.

## 2017-07-06 NOTE — Procedures (Signed)
ELECTROENCEPHALOGRAM REPORT  Date of Study: 07/06/2017  Patient's Name: Sean Strickland MRN: [id number] Date of Birth: 1976/03/05  Referring Provider: Therisa DoyneAnastassia Doutova, MD  Clinical History: 41 year old man with history of hypothyroidism and psychiatric disorder presents with confusion.  Medications: Inpatient Medications: acetaminophen (TYLENOL)  hydrocortisone sodium succinate (SOLU-CORTEF)  levothyroxine (SYNTHROID, LEVOTHROID) liothyronine (CYTOMEL)  ondansetron (ZOFRAN)  thiamine (B-1)     Outpatient Medications amitriptyline (ELAVIL) carbamazepine (TEGRETOL XR) gabapentin (NEURONTIN) levothyroxine (SYNTHROID, LEVOTHROID) naproxen (NAPROSYN) PHENObarbital (LUMINAL)  venlafaxine XR (EFFEXOR-XR)  Technical Summary: A multichannel digital EEG recording measured by the international 10-20 system with electrodes applied with paste and impedances below 5000 ohms performed in our laboratory with EKG monitoring in an awake and drowsy patient.  Hyperventilation was not performed.  Photic stimulation was performed.  The digital EEG was referentially recorded, reformatted, and digitally filtered in a variety of bipolar and referential montages for optimal display.    Description: The patient is awake and drowsy during the recording.  During maximal wakefulness, there is a symmetric, medium voltage 9 Hz posterior dominant rhythm that attenuates with eye opening.  The record is symmetric.  Muscle artifact is seen.  During drowsiness, there is an increase in theta slowing of the background.  Stage 2 sleep is not seen.  Photic stimulation did not elicit any abnormalities.  There were no epileptiform discharges or electrographic seizures seen.    EKG lead was unremarkable.  Impression: This awake and drowsy EEG is normal.    Clinical Correlation: A normal EEG does not exclude a clinical diagnosis of epilepsy.  If further clinical questions remain, prolonged EEG may be helpful.   Clinical correlation is advised.   Shon MilletAdam Redonna Wilbert, DO

## 2017-07-06 NOTE — Progress Notes (Signed)
Patient seen and evaluated earlier the same by my associate. Please refer to H&P for details regarding assessment and plan.  Psychiatry consult placed placed again by me. We'll plan on reassessing next a.m.  Pt is still confused when I spoke to him. Will be interested to know disposition recommendations after psychiatry evaluation.  Gen: pt in nad, alert and awake CV: no cyanosis Pulm: no increased wob, no wheezes  Amelianna Meller

## 2017-07-06 NOTE — Evaluation (Signed)
Physical Therapy Evaluation Patient Details Name: Sean Strickland MRN: [id number] DOB: 1975-12-12 Today's Date: 07/06/2017   History of Present Illness  Pt presenting to ED secondary to AMS. Pt found to have bradycardia and hypothyroidism as well. CT negative for acute abnormality and EEG normal. PMH includes hypothyroidism, current smoker, and psychiatric disorder.   Clinical Impression  Pt presenting with problem above and deficits below. PTA, pt was independent with mobility. Upon eval, pt presenting with decreased balance and wooziness. Checked BP and WNL and HR elevated to low 80s during ambulation with return to low 60s upon return to supine. Required min A for steadying. Do not feel pt will need any follow up PT services at this time, however, will continue to follow acutely to maximize functional mobility independence and safety.     Follow Up Recommendations No PT follow up    Equipment Recommendations  None recommended by PT    Recommendations for Other Services       Precautions / Restrictions Precautions Precautions: Other (comment) Precaution Comments: Watch HR  Restrictions Weight Bearing Restrictions: No      Mobility  Bed Mobility Overal bed mobility: Modified Independent             General bed mobility comments: Increased time, however, no assist required. Reported some dizziness upon sitting, however, BP WNL and did not drop from supine BP.   Transfers Overall transfer level: Needs assistance Equipment used: None Transfers: Sit to/from Stand Sit to Stand: Min guard         General transfer comment: Min guard for safety.   Ambulation/Gait Ambulation/Gait assistance: Min guard Ambulation Distance (Feet): 10 Feet Assistive device: None Gait Pattern/deviations: Decreased stride length;Step-through pattern Gait velocity: Decreased Gait velocity interpretation: Below normal speed for age/gender General Gait Details: Slow, slightly unsteady  gait. Min guard for steadying. Pt reports being unsteadier than usual. Pt handcuffed at hands and feet so stride length decreased. Limited ambulation distance as pt connected to monitors in ED.   Stairs            Wheelchair Mobility    Modified Rankin (Stroke Patients Only)       Balance   Sitting-balance support: No upper extremity supported;Feet supported Sitting balance-Leahy Scale: Good     Standing balance support: No upper extremity supported;During functional activity Standing balance-Leahy Scale: Fair               High level balance activites: Backward walking High Level Balance Comments: Unsteadiness noted with backwards walking in room. Required min guard for steadying assist.              Pertinent Vitals/Pain Pain Assessment: No/denies pain    Home Living Family/patient expects to be discharged to:: [facility]                 [facility]     Prior Function Level of Independence: Independent               Hand Dominance        Extremity/Trunk Assessment   Upper Extremity Assessment Upper Extremity Assessment: Defer to OT evaluation    Lower Extremity Assessment Lower Extremity Assessment: Overall WFL for tasks assessed    Cervical / Trunk Assessment Cervical / Trunk Assessment: Normal  Communication   Communication: No difficulties  Cognition Arousal/Alertness: Suspect due to medications (Very sleepy ) Behavior During Therapy: WFL for tasks assessed/performed Overall Cognitive Status: No family/caregiver present to determine baseline cognitive functioning  General Comments: Officers unsure of baseline cognition and no family caregiver present. Oriented to person and place and required increased time to answer questions about time. Psych history; wondering what medications they were giving him and reported they were trying to knock him out.        General Comments General comments (skin integrity, edema, etc.): Officers present throughout session. HR started in upper 50s to low 60s and elevated to 80s during gait. Returned to low 60s upon return to supine     Exercises     Assessment/Plan    PT Assessment Patient needs continued PT services  PT Problem List Decreased balance;Cardiopulmonary status limiting activity;Decreased cognition;Decreased knowledge of precautions       PT Treatment Interventions Gait training;Stair training;Functional mobility training;Therapeutic activities;Therapeutic exercise;Balance training;Neuromuscular re-education;Cognitive remediation;Patient/family education    PT Goals (Current goals can be found in the Care Plan section)  Acute Rehab PT Goals Patient Stated Goal: to feel better  PT Goal Formulation: With patient Time For Goal Achievement: 07/13/17 Potential to Achieve Goals: Good    Frequency Min 3X/week   Barriers to discharge        Co-evaluation               AM-PAC PT "6 Clicks" Daily Activity  Outcome Measure Difficulty turning over in bed (including adjusting bedclothes, sheets and blankets)?: None Difficulty moving from lying on back to sitting on the side of the bed? : None Difficulty sitting down on and standing up from a chair with arms (e.g., wheelchair, bedside commode, etc,.)?: Unable Help needed moving to and from a bed to chair (including a wheelchair)?: A Little Help needed walking in hospital room?: A Little Help needed climbing 3-5 steps with a railing? : A Little 6 Click Score: 18    End of Session   Activity Tolerance: Patient tolerated treatment well Patient left: in bed;with call bell/phone within reach;Other (comment) (officers present; handcuffed to bed ) Nurse Communication: Mobility status PT Visit Diagnosis: Unsteadiness on feet (R26.81)    Time: [id number] PT Time Calculation (min) (ACUTE ONLY): 19 min   Charges:   PT Evaluation $PT  Eval Low Complexity: 1 Low     PT G Codes:   PT G-Codes **NOT FOR INPATIENT CLASS** Functional Assessment Tool Used: AM-PAC 6 Clicks Basic Mobility Functional Limitation: Mobility: Walking and moving around Mobility: Walking and Moving Around Current Status ([id number]([id number]): At least 40 percent but less than 60 percent impaired, limited or restricted Mobility: Walking and Moving Around Goal Status [phone](G8979): At least 1 percent but less than 20 percent impaired, limited or restricted    Gladys DammeBrittany Keishla Oyer, PT, DPT  Acute Rehabilitation Services  Pager: [phone]   Lehman PromBrittany S Austine Kelsay 07/06/2017, 1:46 PM

## 2017-07-06 NOTE — ED Notes (Signed)
Pt on bedside commode. Pt c/o abdominal cramping. EDP notified.

## 2017-07-06 NOTE — ED Notes (Addendum)
Assisted patient with officers to use bedside commode. Pt had bowel movement as well as urinated.

## 2017-07-07 DIAGNOSIS — R400 Somnolence: Secondary | ICD-10-CM

## 2017-07-07 DIAGNOSIS — F3110 Bipolar disorder, current episode manic without psychotic features, unspecified: Secondary | ICD-10-CM | POA: Diagnosis present

## 2017-07-07 LAB — CARBAMAZEPINE, FREE AND TOTAL
CARBAMAZEPINE, TOTAL: 0.9 ug/mL — AB (ref 4.0–12.0)
Carbamazepine, Free: NOT DETECTED ug/mL (ref 0.6–4.2)

## 2017-07-07 LAB — GLUCOSE, CAPILLARY
GLUCOSE-CAPILLARY: 115 mg/dL — AB (ref 65–99)
Glucose-Capillary: 97 mg/dL (ref 65–99)
Glucose-Capillary: 129 mg/dL — ABNORMAL HIGH (ref 65–99)

## 2017-07-07 LAB — T3: T3, Total: <20 ng/dL — ABNORMAL LOW (ref 71–180)

## 2017-07-07 LAB — MRSA PCR SCREENING: MRSA by PCR: NEGATIVE

## 2017-07-07 MED ORDER — LEVOTHYROXINE SODIUM 175 MCG PO TABS: 175.0000 ug | ORAL_TABLET | Freq: Every day | ORAL | 0 refills | Status: AC

## 2017-07-07 NOTE — Discharge Summary (Signed)
Physician Discharge Summary  Sean Strickland WUJ:[id number] DOB: [birth year] DOA: 07/05/2017  PCP: Patient, No Pcp Per  Admit date: 07/05/2017 Discharge date: 07/07/2017  Time spent: > 35 minutes  Recommendations for Outpatient Follow-up:  1.    Discharge Diagnoses:  Active Problems:   Bradycardia   Acute metabolic encephalopathy   Acute encephalopathy   Myxedema coma (HCC)   Rhabdomyolysis   Discharge Condition: stable  Diet recommendation: Regular diet  Filed Weights   07/06/17 1637 07/07/17 0324  Weight: 75.3 kg (166 lb 1.6 oz) 75.5 kg (166 lb 8 oz)    History of present illness:  41 y.o. male with medical history significant of hypothyroidism and psychiatric disorder    Presented with altered mental status comes from [place] was given 2 mg of Ativan IM prior to arriving to ER. Unable to provide his own history but per jail staff has become more confused over past 1-2 weeks.  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 He was on 175 mcg of synthroid but was discontinued be cause he was not taking.  For other known medical problems listed above will continue prior to admission medication regimen.  Procedures:  None  Consultations:  None  Discharge Exam: Vitals:   07/07/17 0806 07/07/17 1235  BP: 108/68 (!) 106/57  Pulse: (!) 55 66  Resp: 18 18  Temp: 97.8 F (36.6 C) 97.7 F (36.5 C)  SpO2: 100% 99%    General: Pt in nad, alert and awake Cardiovascular: rrr, no rubs Respiratory: no increased wob, no wheezes  Discharge Instructions   Discharge Instructions    Call MD for:  severe uncontrolled pain    Complete by:  As directed    Call MD for:  temperature >100.4    Complete by:  As directed    Diet - low sodium heart healthy    Complete  by:  As directed    Discharge instructions    Complete by:  As directed    Please have pcp monitor thyroid levels. Patient will need to continue to take his thyroid medication   Increase activity slowly    Complete by:  As directed      Current Discharge Medication List    START taking these medications   Details  levothyroxine (SYNTHROID, LEVOTHROID) 175 MCG tablet Take 1 tablet (175 mcg total) by mouth daily. Qty: 30 tablet, Refills: 0      CONTINUE these medications which have NOT CHANGED   Details  busPIRone (BUSPAR) 30 MG tablet Take 30 mg by mouth 2 (two) times daily.    gabapentin (NEURONTIN) 300 MG capsule Take 600 mg by mouth 2 (two) times daily.    lamoTRIgine (LAMICTAL) 200 MG tablet Take 400 mg by mouth every evening.    mirtazapine (REMERON) 7.5 MG tablet Take 7.5 mg by mouth at bedtime.    PHENobarbital (LUMINAL) 32.4 MG tablet Take 64.8 mg by mouth 2 (two) times daily.    carbamazepine (TEGRETOL XR) 200 MG 12 hr tablet Take 200 mg by mouth 2 (two) times daily.    venlafaxine XR (EFFEXOR-XR) 75 MG 24 hr capsule Take 225 mg by mouth daily with breakfast.       No Known Allergies    The results of significant diagnostics from this hospitalization (including imaging, microbiology, ancillary and laboratory)  are listed below for reference.    Significant Diagnostic Studies: Ct Head Wo Contrast  Result Date: 07/05/2017 CLINICAL DATA:  Altered mental status.  Pale, lethargic. EXAM: CT HEAD WITHOUT CONTRAST TECHNIQUE: Contiguous axial images were obtained from the base of the skull through the vertex without intravenous contrast. COMPARISON:  None. FINDINGS: Brain: Ventricles are normal in size and configuration. All areas of the brain demonstrate normal gray-white matter attenuation. There is no mass, hemorrhage, edema or other evidence of acute parenchymal abnormality. No extra-axial hemorrhage. Vascular: No hyperdense vessel or unexpected calcification. Skull:  Normal. Negative for fracture or focal lesion. Sinuses/Orbits: No acute finding. Other: Probable mild soft tissue edema within the superficial soft tissues overlying the midline frontal bones. IMPRESSION: 1. Probable mild soft tissue edema overlying the midline frontal bones. No underlying fracture. 2. No acute intracranial abnormality. No intracranial mass, hemorrhage or edema. Electronically Signed   By: Bary RichardStan  Maynard M.D.   On: 07/05/2017 21:06   Dg Chest Portable 1 View  Result Date: 07/05/2017 CLINICAL DATA:  Acute onset of altered mental status. Initial encounter. EXAM: PORTABLE CHEST 1 VIEW COMPARISON:  None. FINDINGS: The lungs are well-aerated and clear. There is no evidence of focal opacification, pleural effusion or pneumothorax. The cardiomediastinal silhouette is within normal limits. No acute osseous abnormalities are seen. IMPRESSION: No acute cardiopulmonary process seen. Electronically Signed   By: Roanna RaiderJeffery  Chang M.D.   On: 07/05/2017 22:46    Microbiology: Recent Results (from the past 240 hour(s))  MRSA PCR Screening     Status: None   Collection Time: 07/07/17  4:50 AM  Result Value Ref Range Status   MRSA by PCR NEGATIVE NEGATIVE Final    Comment:        The GeneXpert MRSA Assay (FDA approved for NASAL specimens only), is one component of a comprehensive MRSA colonization surveillance program. It is not intended to diagnose MRSA infection nor to guide or monitor treatment for MRSA infections.      Labs: Basic Metabolic Panel:  Recent Labs Lab 07/05/17 1927 07/06/17 0013 07/06/17 0425  NA 138  --  136  K 4.0  --  3.3*  CL 104  --  105  CO2 25  --  21*  GLUCOSE 77  --  71  BUN 25*  --  22*  CREATININE 1.01  --  0.80  CALCIUM 8.7*  --  8.2*  MG  --  2.4 2.4  PHOS  --  3.4 3.0   Liver Function Tests:  Recent Labs Lab 07/05/17 1927 07/06/17 0425  AST 134* 101*  ALT 59 48  ALKPHOS 80 75  BILITOT 1.2 0.8  PROT 6.9 5.9*  ALBUMIN 4.2 3.8   No  results for input(s): LIPASE, AMYLASE in the last 168 hours.  Recent Labs Lab 07/06/17 0017  AMMONIA 39*   CBC:  Recent Labs Lab 07/05/17 1927 07/06/17 0425  WBC 9.5 8.5  HGB 13.5 13.6  HCT 38.9* 39.4  MCV 81.0 81.2  PLT 277 264   Cardiac Enzymes:  Recent Labs Lab 07/06/17 0013 07/06/17 0425  CKTOTAL 2,781* 2,313*   BNP: BNP (last 3 results) No results for input(s): BNP in the last 8760 hours.  ProBNP (last 3 results) No results for input(s): PROBNP in the last 8760 hours.  CBG:  Recent Labs Lab 07/06/17 1826 07/06/17 2355 07/07/17 0455 07/07/17 0757 07/07/17 1122  GLUCAP 91 104* 97 115* 129*    Signed:  Penny PiaVEGA, Analyse Angst MD.  [facility] 07/07/2017,  1:13 PM

## 2017-07-07 NOTE — Progress Notes (Signed)
Physical Therapy Treatment/Discharge Patient Details Name: Sean Strickland MRN: [id number] DOB: 02/10/1976 Today's Date: 07/07/2017    History of Present Illness Pt presenting to ED secondary to AMS. Pt found to have bradycardia and hypothyroidism as well. CT negative for acute abnormality and EEG normal. PMH includes hypothyroidism, current smoker, and psychiatric disorder.     PT Comments    Patient tolerated increased gait distance of 314f and without gait deviations with high level balance activities and reaching outside BOS. Min guard/supervision for safety with ambulation due to pt being handcuffed at wrists and ankles. It is likely that pt is mod I level without handcuffs. Pt with HR at lowest 66 and up to 87 with mobility. Officers present throughout session. Pt has reached acute PT goals and is being discharged from PT services.     Follow Up Recommendations  No PT follow up     Equipment Recommendations  None recommended by PT    Recommendations for Other Services       Precautions / Restrictions Precautions Precautions: Other (comment) Precaution Comments: Watch HR  Restrictions Weight Bearing Restrictions: No    Mobility  Bed Mobility Overal bed mobility: Independent             General bed mobility comments: Increased time without physical assistance.  Transfers Overall transfer level: Modified independent Equipment used: None Transfers: Sit to/from Stand Sit to Stand: Min guard         General transfer comment: increased effort likely due to having hands cuffed  Ambulation/Gait Ambulation/Gait assistance: Min guard;Supervision Ambulation Distance (Feet): 300 Feet Assistive device: None Gait Pattern/deviations: Decreased stride length;Step-through pattern Gait velocity: Decreased   General Gait Details: pt with decreased stride length due to hand cuffed at feet and hands; pt without significant gait deviations with horizontal or vertical  head turns, turning, and directional changes; min guard for safety due to being handcuffed   Stairs            Wheelchair Mobility    Modified Rankin (Stroke Patients Only)       Balance Overall balance assessment: Needs assistance Sitting-balance support: No upper extremity supported;Feet supported Sitting balance-Leahy Scale: Good     Standing balance support: No upper extremity supported;During functional activity Standing balance-Leahy Scale: Good Standing balance comment: unstable with dynamic tasks at times.                             Cognition Arousal/Alertness: Awake/alert Behavior During Therapy: WFL for tasks assessed/performed Overall Cognitive Status: No family/caregiver present to determine baseline cognitive functioning                                 General Comments: pt oriented to person, place, and time/date; pt continues to be fixated on people at jail trying to kill him and make him take medication that requires him to stay standing because he will have a heart attack if he sits down      Exercises      General Comments General comments (skin integrity, edema, etc.): officers present throughout session; pt with HR of 66 at lowest noted and up to 87 with mobility      Pertinent Vitals/Pain Pain Assessment: Faces Faces Pain Scale: Hurts little more Pain Location: bilat feet Pain Descriptors / Indicators: Burning;Aching Pain Intervention(s): Monitored during session;Premedicated before session;Repositioned    Home Living Family/patient expects  to be discharged to:: [facility]               [facility]     Prior Function Level of Independence: Independent          PT Goals (current goals can now be found in the care plan section) Acute Rehab PT Goals Patient Stated Goal: to feel better  PT Goal Formulation: With patient Time For Goal Achievement: 07/13/17 Potential to  Achieve Goals: Good Progress towards PT goals: Goals met/education completed, patient discharged from PT    Frequency    Min 3X/week      PT Plan Current plan remains appropriate    Co-evaluation              AM-PAC PT "6 Clicks" Daily Activity  Outcome Measure  Difficulty turning over in bed (including adjusting bedclothes, sheets and blankets)?: None Difficulty moving from lying on back to sitting on the side of the bed? : None Difficulty sitting down on and standing up from a chair with arms (e.g., wheelchair, bedside commode, etc,.)?: A Lot Help needed moving to and from a bed to chair (including a wheelchair)?: A Little Help needed walking in hospital room?: A Little Help needed climbing 3-5 steps with a railing? : A Little 6 Click Score: 19    End of Session Equipment Utilized During Treatment: Gait belt Activity Tolerance: Patient tolerated treatment well Patient left: in bed;with call bell/phone within reach;Other (comment) (officers present; handcuffed to bed ) Nurse Communication: Mobility status PT Visit Diagnosis: Unsteadiness on feet (R26.81)     Time: 1517-6160 PT Time Calculation (min) (ACUTE ONLY): 21 min  Charges:  $Gait Training: 8-22 mins                    G Codes:       [name], PTA Pager: [phone]     [name] 07/07/2017, 1:02 PM

## 2017-07-07 NOTE — Clinical Social Work Note (Signed)
Patient admitted from jail and will likely return once stable for discharge. No social work needs at this time.   CSW signing off. Consult again if any social work needs arise.  Charlynn CourtSarah Jamarri Vuncannon, CSW [phone]

## 2017-07-07 NOTE — Evaluation (Signed)
Occupational Therapy Evaluation Patient Details Name: Sean Strickland MRN: [id number] DOB: [birth year] Today's Date: 07/07/2017    History of Present Illness Pt presenting to ED secondary to AMS. Pt found to have bradycardia and hypothyroidism as well. CT negative for acute abnormality and EEG normal. PMH includes hypothyroidism, current smoker, and psychiatric disorder.    Clinical Impression   PTA, pt was independent with ADL and functional mobility. Pt presenting to OT with decreased stability during simulated toilet transfers and decreased B UE strength and coordination impacting his independence with ADL participation. Pt very concerned with the medication he has been receiving requiring redirection back to task at hand throughout activities. Unsure of pt's cognitive baseline. He would benefit from continued OT services while admitted to maximize independence and safety with ADL and functional mobility prior to D/C. Do not anticipate need for OT follow-up post-acute D/C.    Follow Up Recommendations  No OT follow up;Supervision/Assistance - 24 hour    Equipment Recommendations  None recommended by OT    Recommendations for Other Services       Precautions / Restrictions Precautions Precautions: Other (comment) Precaution Comments: Watch HR  Restrictions Weight Bearing Restrictions: No      Mobility Bed Mobility Overal bed mobility: Modified Independent             General bed mobility comments: Increased time without physical assistance.  Transfers Overall transfer level: Needs assistance Equipment used: None Transfers: Sit to/from Stand Sit to Stand: Min guard         General transfer comment: Min guard for safety.     Balance Overall balance assessment: Needs assistance Sitting-balance support: No upper extremity supported;Feet supported Sitting balance-Leahy Scale: Good     Standing balance support: No upper extremity supported;During functional  activity Standing balance-Leahy Scale: Fair Standing balance comment: unstable with dynamic tasks at times.                            ADL either performed or assessed with clinical judgement   ADL Overall ADL's : Needs assistance/impaired Eating/Feeding: Set up;Sitting   Grooming: Minimal assistance;Standing Grooming Details (indicate cue type and reason): LOB at sink Upper Body Bathing: Min guard;Sitting   Lower Body Bathing: Min guard;Sit to/from stand   Upper Body Dressing : Min guard;Sitting   Lower Body Dressing: Min guard;Sit to/from stand   Toilet Transfer: Min guard;Ambulation;Minimal assistance Toilet Transfer Details (indicate cue type and reason): Min guard assist initially but increased to min due to unsteadiness and LOB Toileting- Clothing Manipulation and Hygiene: Min guard;Sit to/from stand       Functional mobility during ADLs: Min guard;Minimal assistance General ADL Comments: Pt with fluctuating assist levels required during mobility due to unsteadiness at times.      Vision Patient Visual Report: No change from baseline Vision Assessment?: No apparent visual deficits Additional Comments: Tracks well and able to read menu items clearly.      Perception     Praxis      Pertinent Vitals/Pain Pain Assessment: No/denies pain     Hand Dominance     Extremity/Trunk Assessment Upper Extremity Assessment Upper Extremity Assessment: Defer to OT evaluation;RUE deficits/detail;LUE deficits/detail RUE Deficits / Details: Decreased strength 4/5 grossly and decreased fine motor coordination.  LUE Deficits / Details: Decreased strength 4/5 grossly and decreased fine motor coordination.    Lower Extremity Assessment Lower Extremity Assessment: Defer to PT evaluation       Communication  Communication Communication: No difficulties   Cognition Arousal/Alertness: Awake/alert Behavior During Therapy: WFL for tasks assessed/performed Overall  Cognitive Status: No family/caregiver present to determine baseline cognitive functioning                                 General Comments: Oriented to person, place and utilized environmental cues (clock with date and day of the week) to determine time. Pt was very concerned about his recent isolation and reporting that they were trying to kill him with medication at the jail. Note psych history. Pt was able to abstract similarities between objects and utilize menu to problem solve answers to questions about upcoming meals.    General Comments  Officers present throughout session.    Exercises     Shoulder Instructions      Home Living Family/patient expects to be discharged to:: [facility]                                 Additional Comments[facility]       Prior Functioning/Environment Level of Independence: Independent                 OT Problem List: Decreased strength;Decreased activity tolerance;Impaired balance (sitting and/or standing);Decreased safety awareness;Decreased knowledge of use of DME or AE;Decreased knowledge of precautions;Pain      OT Treatment/Interventions: Self-care/ADL training;Therapeutic exercise;Energy conservation;DME and/or AE instruction;Therapeutic activities;Patient/family education;Balance training    OT Goals(Current goals can be found in the care plan section) Acute Rehab OT Goals Patient Stated Goal: to feel better  OT Goal Formulation: With patient Time For Goal Achievement: 07/14/17 Potential to Achieve Goals: Good ADL Goals Pt Will Perform Grooming: with modified independence;standing Pt Will Perform Upper Body Dressing: with modified independence;sitting Pt Will Perform Lower Body Dressing: with modified independence;sit to/from stand Pt Will Transfer to Toilet: with modified independence;ambulating;regular height toilet Pt Will Perform Toileting - Clothing Manipulation and hygiene: with  modified independence;sit to/from stand Pt/caregiver will Perform Home Exercise Program: Both right and left upper extremity;Independently;With written HEP provided (fine motor coordination bilaterally)  OT Frequency: Min 2X/week   Barriers to D/C:            Co-evaluation              AM-PAC PT "6 Clicks" Daily Activity     Outcome Measure Help from another person eating meals?: A Little Help from another person taking care of personal grooming?: A Little Help from another person toileting, which includes using toliet, bedpan, or urinal?: A Little Help from another person bathing (including washing, rinsing, drying)?: A Little Help from another person to put on and taking off regular upper body clothing?: A Little Help from another person to put on and taking off regular lower body clothing?: A Little 6 Click Score: 18   End of Session Equipment Utilized During Treatment: Gait belt Nurse Communication: Mobility status (checked in to make sure )  Activity Tolerance: Patient tolerated treatment well Patient left: in bed;with call bell/phone within reach (with officers present)  OT Visit Diagnosis: Other abnormalities of gait and mobility (R26.89)                Time: 4098-1191: 0915-0935 OT Time Calculation (min): 20 min Charges:  OT General Charges $OT Visit: 1 Visit OT Evaluation $OT Eval Moderate Complexity: 1 Mod G-Codes:  Doristine Section, MS OTR/L  Pager: [phone]   Mandell Pangborn A Bianco Cange 07/07/2017, 11:57 AM

## 2018-03-14 IMAGING — DX DG CHEST 1V PORT
1 series · 1 of 1 positions shown · non-contrast
Comparison: None.

CLINICAL DATA: Acute onset of altered mental status. Initial
encounter.

EXAM:
PORTABLE CHEST 1 VIEW

[chest ap]
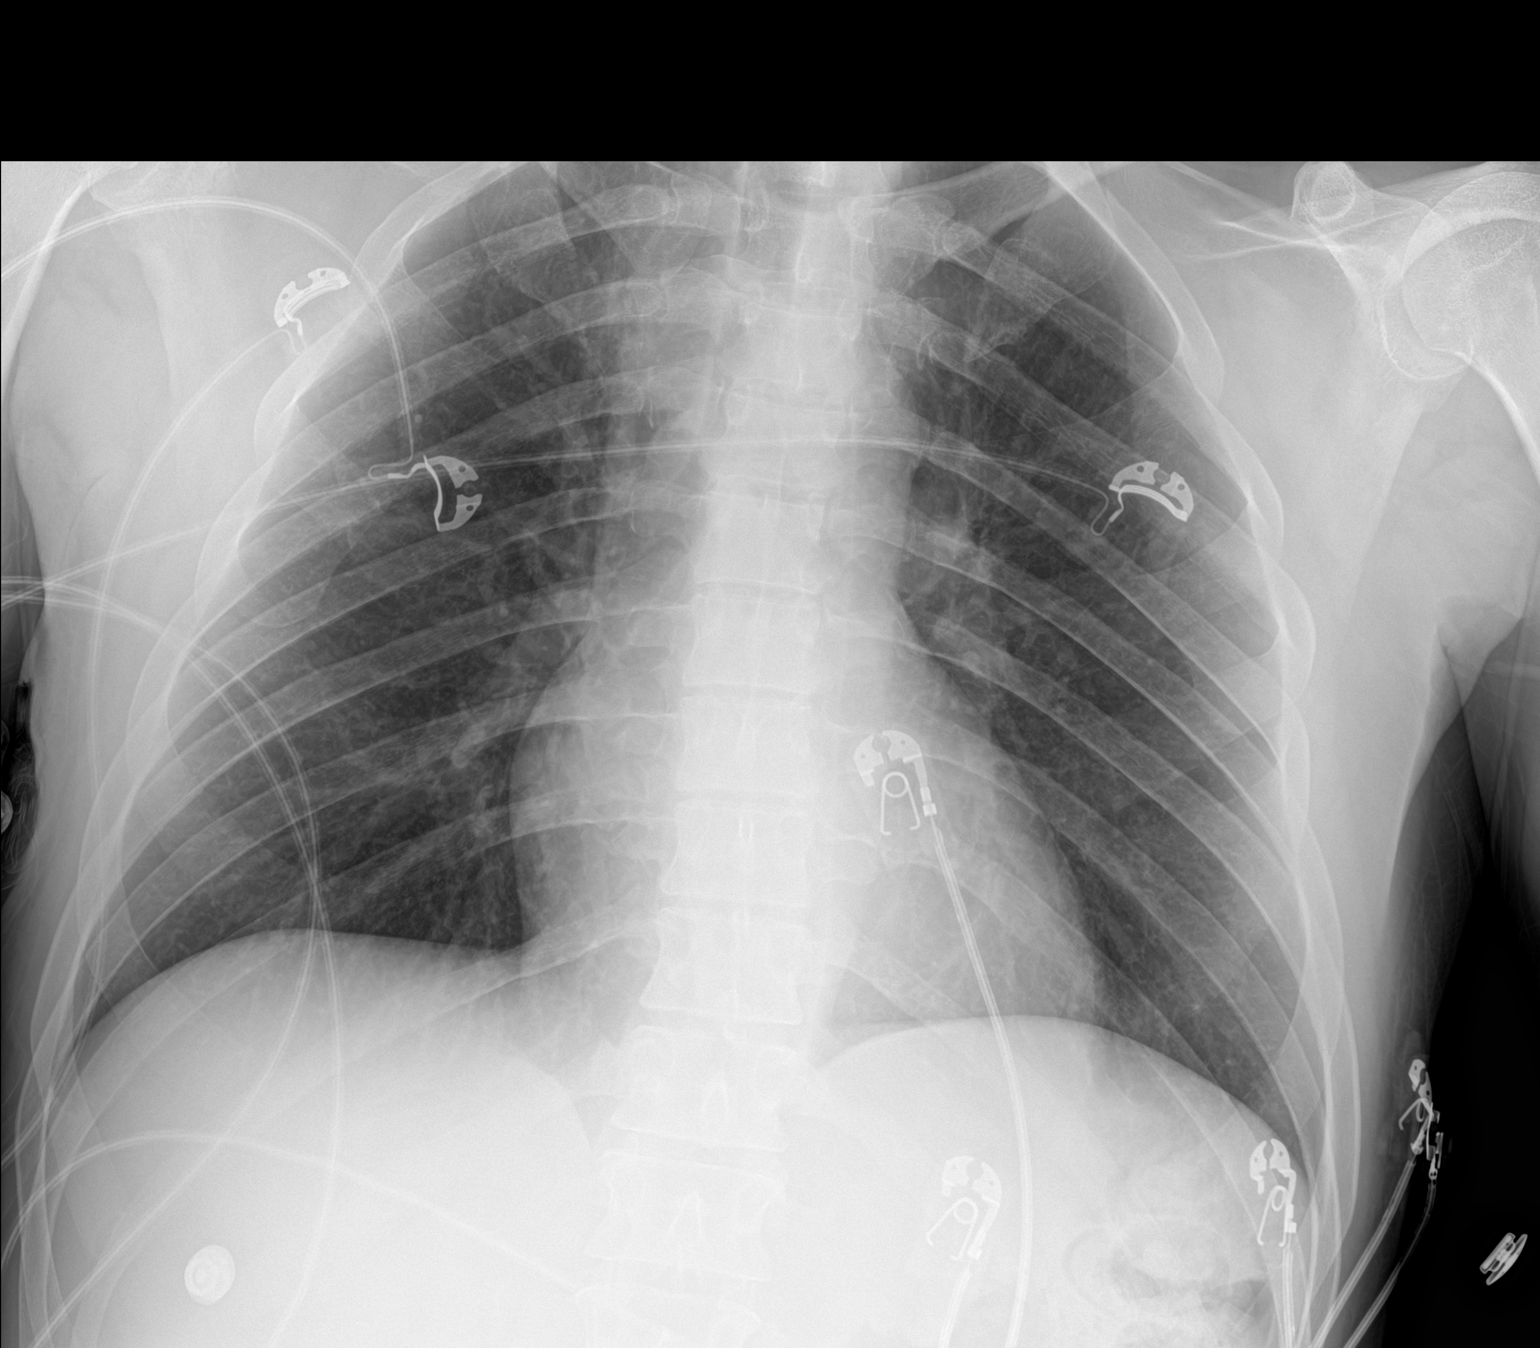

[1 of 1 positions shown; findings below may reference images not displayed]

FINDINGS: The lungs are well-aerated and clear. There is no evidence of focal
opacification, pleural effusion or pneumothorax.

The cardiomediastinal silhouette is within normal limits. No acute
osseous abnormalities are seen.
IMPRESSION: No acute cardiopulmonary process seen.

## 2018-03-14 IMAGING — CT CT HEAD W/O CM
2 of 4 series · 10 of 30 positions shown, 12 images · non-contrast
Comparison: None.

CLINICAL DATA: Altered mental status.  Pale, lethargic.

EXAM:
CT HEAD WITHOUT CONTRAST
TECHNIQUE: Contiguous axial images were obtained from the base of the skull
through the vertex without intravenous contrast.

[Series 5: cor soft · oblique · 0.32mm/px · 7 of 75 slices shown, 9 images]
[im 10/75  brain]
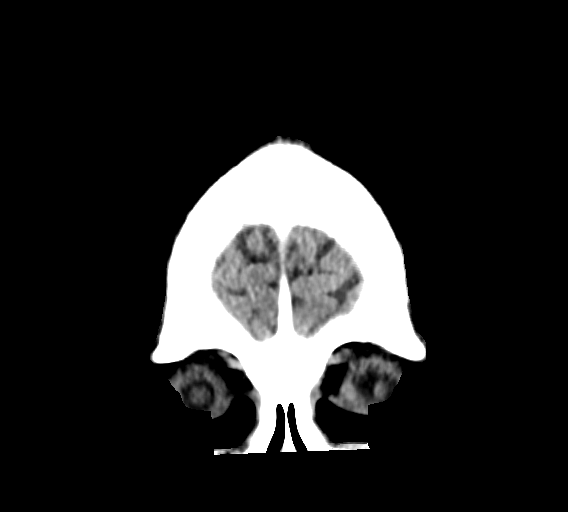
[im 10/75  bone]
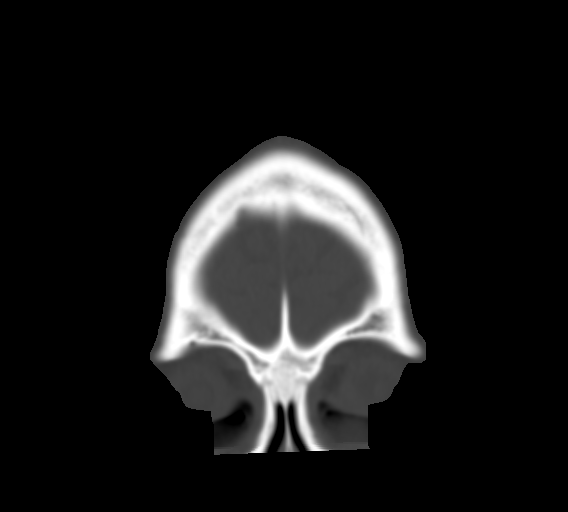
[im 19/75  brain]
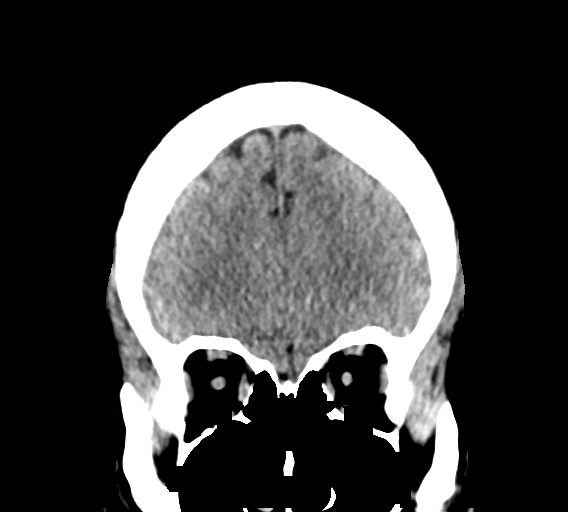
[im 28/75  brain]
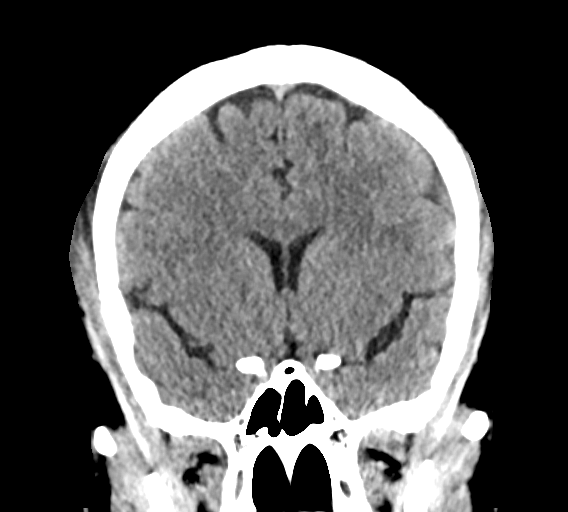
[im 38/75  brain]
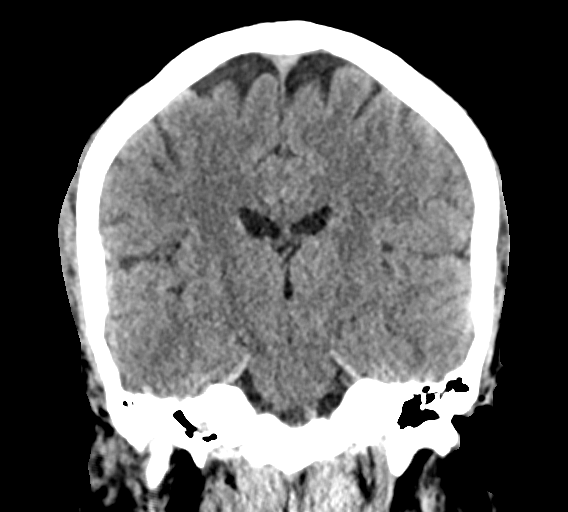
[im 47/75  brain]
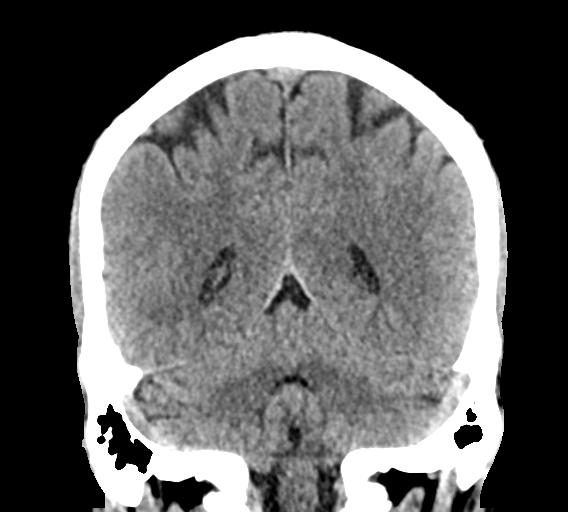
[im 47/75  bone]
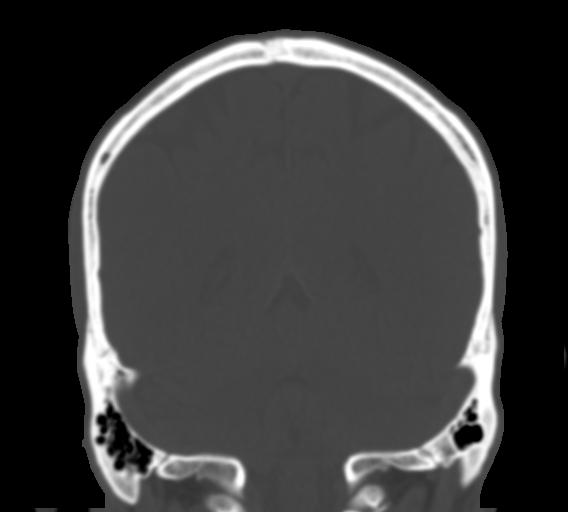
[im 56/75  brain]
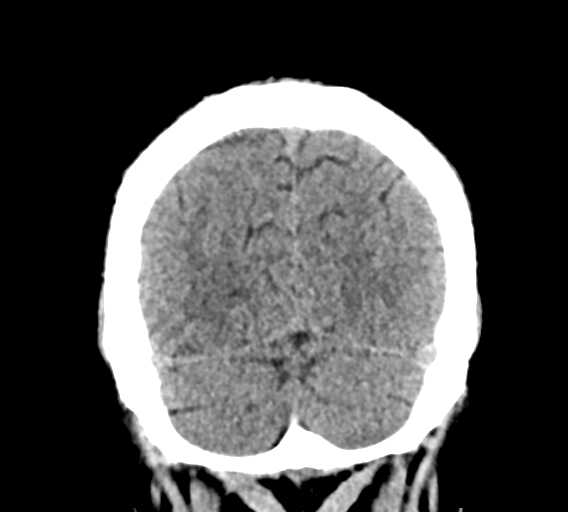
[im 65/75  brain]
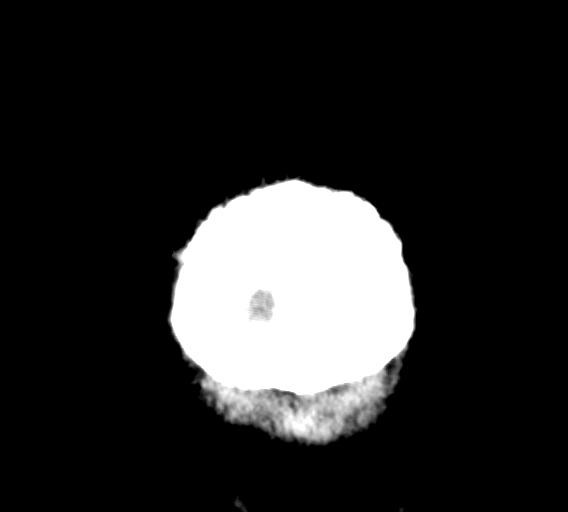

[Series 6: sag soft · sagittal · 0.35mm/px · 3 of 59 slices shown]
[im 20/59  brain]
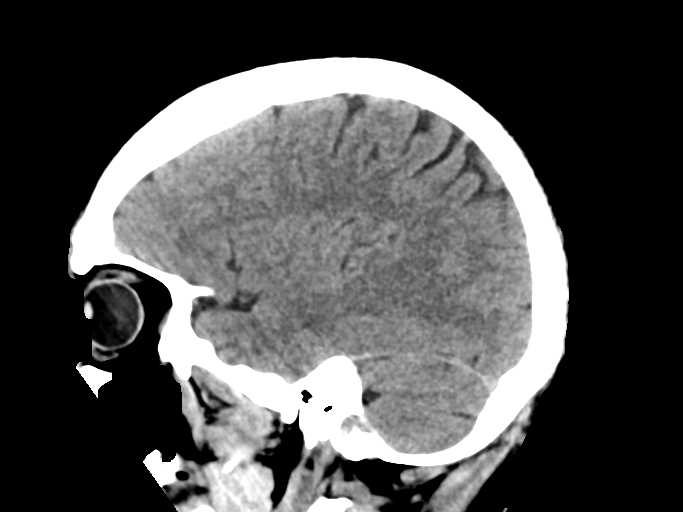
[im 30/59  brain]
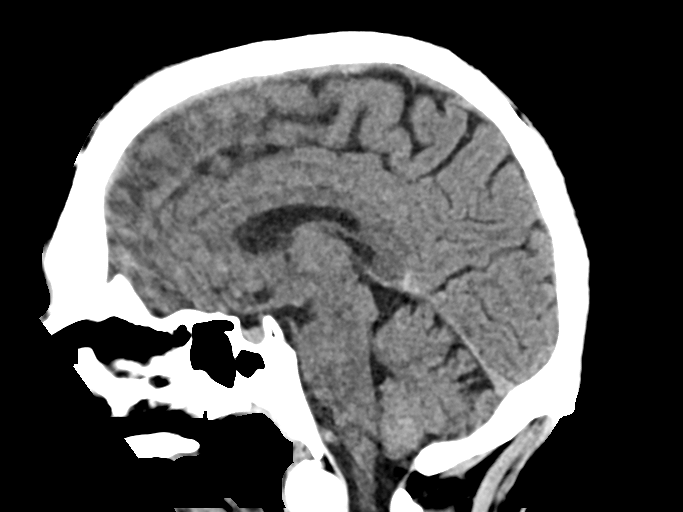
[im 39/59  brain]
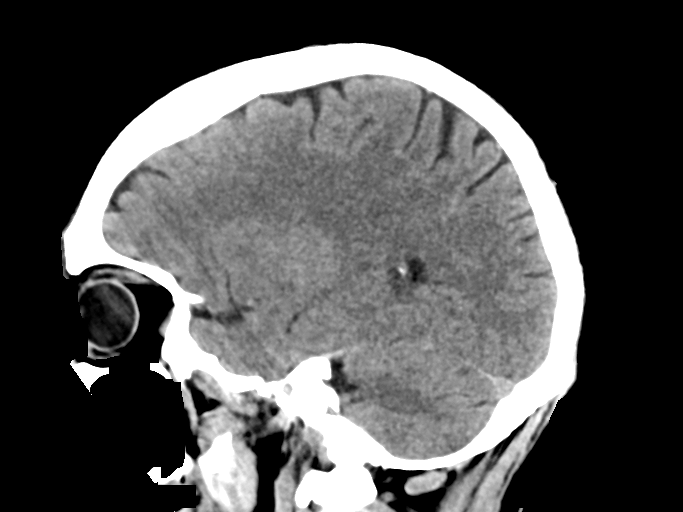

[10 of 30 positions shown; findings below may reference images not displayed]

FINDINGS: Brain: Ventricles are normal in size and configuration. All areas of
the brain demonstrate normal gray-white matter attenuation. There is
no mass, hemorrhage, edema or other evidence of acute parenchymal
abnormality. No extra-axial hemorrhage.

Vascular: No hyperdense vessel or unexpected calcification.

Skull: Normal. Negative for fracture or focal lesion.

Sinuses/Orbits: No acute finding.

Other: Probable mild soft tissue edema within the superficial soft
tissues overlying the midline frontal bones.
IMPRESSION: 1. Probable mild soft tissue edema overlying the midline frontal
bones. No underlying fracture.
2. No acute intracranial abnormality. No intracranial mass,
hemorrhage or edema.
